# Patient Record
Sex: Male | Born: 1975 | Race: White | Hispanic: No | Marital: Married | Smoking: Never smoker
Health system: Southern US, Community
[De-identification: ages and names within clinical notes are randomized; demographics above are authoritative.]

## PROBLEM LIST (undated history)

## (undated) DIAGNOSIS — C8190 Hodgkin lymphoma, unspecified, unspecified site: Secondary | ICD-10-CM

## (undated) DIAGNOSIS — E079 Disorder of thyroid, unspecified: Secondary | ICD-10-CM

## (undated) DIAGNOSIS — Z8579 Personal history of other malignant neoplasms of lymphoid, hematopoietic and related tissues: Secondary | ICD-10-CM

## (undated) DIAGNOSIS — L709 Acne, unspecified: Secondary | ICD-10-CM

## (undated) HISTORY — DX: Personal history of other malignant neoplasms of lymphoid, hematopoietic and related tissues: Z85.79

## (undated) HISTORY — DX: Disorder of thyroid, unspecified: E07.9

## (undated) HISTORY — DX: Hodgkin lymphoma, unspecified, unspecified site: C81.90

## (undated) HISTORY — DX: Acne, unspecified: L70.9

---

## 2007-05-14 ENCOUNTER — Inpatient Hospital Stay (HOSPITAL_COMMUNITY): Admission: EM | Admit: 2007-05-14 | Discharge: 2007-05-18 | Payer: Self-pay | Admitting: Emergency Medicine

## 2007-05-14 ENCOUNTER — Ambulatory Visit: Payer: Self-pay | Admitting: Cardiology

## 2007-05-14 ENCOUNTER — Ambulatory Visit: Payer: Self-pay | Admitting: Internal Medicine

## 2007-05-14 ENCOUNTER — Ambulatory Visit: Payer: Self-pay | Admitting: Oncology

## 2007-05-15 ENCOUNTER — Encounter (INDEPENDENT_AMBULATORY_CARE_PROVIDER_SITE_OTHER): Payer: Self-pay | Admitting: Interventional Radiology

## 2007-05-16 ENCOUNTER — Encounter (HOSPITAL_COMMUNITY): Payer: Self-pay | Admitting: Oncology

## 2007-05-17 ENCOUNTER — Encounter (HOSPITAL_COMMUNITY): Payer: Self-pay | Admitting: Oncology

## 2007-05-17 ENCOUNTER — Encounter (INDEPENDENT_AMBULATORY_CARE_PROVIDER_SITE_OTHER): Payer: Self-pay | Admitting: Interventional Radiology

## 2007-05-18 ENCOUNTER — Ambulatory Visit: Payer: Self-pay | Admitting: Oncology

## 2007-05-22 ENCOUNTER — Ambulatory Visit (HOSPITAL_COMMUNITY): Admission: RE | Admit: 2007-05-22 | Discharge: 2007-05-22 | Payer: Self-pay | Admitting: Oncology

## 2007-05-23 ENCOUNTER — Encounter (HOSPITAL_COMMUNITY): Payer: Self-pay | Admitting: Oncology

## 2007-05-23 ENCOUNTER — Other Ambulatory Visit: Admission: RE | Admit: 2007-05-23 | Discharge: 2007-05-23 | Payer: Self-pay | Admitting: Oncology

## 2007-05-24 ENCOUNTER — Ambulatory Visit: Admission: RE | Admit: 2007-05-24 | Discharge: 2007-05-24 | Payer: Self-pay | Admitting: Oncology

## 2007-05-25 LAB — CBC WITH DIFFERENTIAL/PLATELET
BASO%: 0.8 % (ref 0.0–2.0)
Basophils Absolute: 0.1 10*3/uL (ref 0.0–0.1)
EOS%: 3 % (ref 0.0–7.0)
HCT: 37.5 % — ABNORMAL LOW (ref 38.7–49.9)
LYMPH%: 5.7 % — ABNORMAL LOW (ref 14.0–48.0)
MCH: 25.3 pg — ABNORMAL LOW (ref 28.0–33.4)
MCHC: 32.8 g/dL (ref 32.0–35.9)
MCV: 77 fL — ABNORMAL LOW (ref 81.6–98.0)
MONO%: 8.8 % (ref 0.0–13.0)
NEUT%: 81.7 % — ABNORMAL HIGH (ref 40.0–75.0)
lymph#: 0.7 10*3/uL — ABNORMAL LOW (ref 0.9–3.3)

## 2007-05-25 LAB — ERYTHROCYTE SEDIMENTATION RATE: Sed Rate: 106 mm/hr — ABNORMAL HIGH (ref 0–20)

## 2007-06-06 ENCOUNTER — Ambulatory Visit (HOSPITAL_COMMUNITY): Admission: RE | Admit: 2007-06-06 | Discharge: 2007-06-06 | Payer: Self-pay | Admitting: Oncology

## 2007-06-06 LAB — COMPREHENSIVE METABOLIC PANEL
Albumin: 4.1 g/dL (ref 3.5–5.2)
Alkaline Phosphatase: 122 U/L — ABNORMAL HIGH (ref 39–117)
CO2: 25 mEq/L (ref 19–32)
Calcium: 9.4 mg/dL (ref 8.4–10.5)
Chloride: 104 mEq/L (ref 96–112)
Glucose, Bld: 100 mg/dL — ABNORMAL HIGH (ref 70–99)
Potassium: 4 mEq/L (ref 3.5–5.3)
Sodium: 141 mEq/L (ref 135–145)
Total Protein: 7.4 g/dL (ref 6.0–8.3)

## 2007-06-06 LAB — CBC WITH DIFFERENTIAL/PLATELET
Basophils Absolute: 0.1 10*3/uL (ref 0.0–0.1)
EOS%: 3.8 % (ref 0.0–7.0)
Eosinophils Absolute: 0.7 10*3/uL — ABNORMAL HIGH (ref 0.0–0.5)
HCT: 39.3 % (ref 38.7–49.9)
HGB: 12.8 g/dL — ABNORMAL LOW (ref 13.0–17.1)
MONO#: 0.9 10*3/uL (ref 0.1–0.9)
NEUT#: 14.5 10*3/uL — ABNORMAL HIGH (ref 1.5–6.5)
NEUT%: 80.5 % — ABNORMAL HIGH (ref 40.0–75.0)
RDW: 16.4 % — ABNORMAL HIGH (ref 11.2–14.6)
WBC: 18.1 10*3/uL — ABNORMAL HIGH (ref 4.0–10.0)
lymph#: 1.8 10*3/uL (ref 0.9–3.3)

## 2007-06-06 LAB — URIC ACID: Uric Acid, Serum: 5.4 mg/dL (ref 4.0–7.8)

## 2007-06-15 LAB — CBC WITH DIFFERENTIAL/PLATELET
BASO%: 0.4 % (ref 0.0–2.0)
EOS%: 2.5 % (ref 0.0–7.0)
HCT: 37.7 % — ABNORMAL LOW (ref 38.7–49.9)
LYMPH%: 12.5 % — ABNORMAL LOW (ref 14.0–48.0)
MCH: 25.6 pg — ABNORMAL LOW (ref 28.0–33.4)
MCHC: 33.1 g/dL (ref 32.0–35.9)
NEUT%: 82.8 % — ABNORMAL HIGH (ref 40.0–75.0)
RBC: 4.88 10*6/uL (ref 4.20–5.71)
lymph#: 1.7 10*3/uL (ref 0.9–3.3)

## 2007-07-04 ENCOUNTER — Ambulatory Visit: Payer: Self-pay | Admitting: Oncology

## 2007-07-06 ENCOUNTER — Ambulatory Visit (HOSPITAL_COMMUNITY): Admission: RE | Admit: 2007-07-06 | Discharge: 2007-07-06 | Payer: Self-pay | Admitting: Oncology

## 2007-07-06 LAB — CBC WITH DIFFERENTIAL/PLATELET
Basophils Absolute: 0.2 10*3/uL — ABNORMAL HIGH (ref 0.0–0.1)
Eosinophils Absolute: 0.5 10*3/uL (ref 0.0–0.5)
HCT: 40.6 % (ref 38.7–49.9)
HGB: 13.5 g/dL (ref 13.0–17.1)
LYMPH%: 13.2 % — ABNORMAL LOW (ref 14.0–48.0)
MCV: 81.2 fL — ABNORMAL LOW (ref 81.6–98.0)
MONO#: 0.9 10*3/uL (ref 0.1–0.9)
MONO%: 6.4 % (ref 0.0–13.0)
NEUT#: 10.4 10*3/uL — ABNORMAL HIGH (ref 1.5–6.5)
NEUT%: 75.4 % — ABNORMAL HIGH (ref 40.0–75.0)
Platelets: 220 10*3/uL (ref 145–400)
RBC: 5 10*6/uL (ref 4.20–5.71)
WBC: 13.8 10*3/uL — ABNORMAL HIGH (ref 4.0–10.0)

## 2007-07-20 LAB — URIC ACID: Uric Acid, Serum: 5.2 mg/dL (ref 4.0–7.8)

## 2007-07-20 LAB — COMPREHENSIVE METABOLIC PANEL
ALT: 32 U/L (ref 0–53)
Alkaline Phosphatase: 87 U/L (ref 39–117)
CO2: 23 mEq/L (ref 19–32)
Creatinine, Ser: 0.71 mg/dL (ref 0.40–1.50)
Sodium: 139 mEq/L (ref 135–145)
Total Bilirubin: 0.6 mg/dL (ref 0.3–1.2)
Total Protein: 6.7 g/dL (ref 6.0–8.3)

## 2007-07-20 LAB — CBC WITH DIFFERENTIAL/PLATELET
BASO%: 1.4 % (ref 0.0–2.0)
HCT: 40.9 % (ref 38.7–49.9)
LYMPH%: 13.3 % — ABNORMAL LOW (ref 14.0–48.0)
MCH: 27.6 pg — ABNORMAL LOW (ref 28.0–33.4)
MCHC: 33.8 g/dL (ref 32.0–35.9)
MCV: 81.5 fL — ABNORMAL LOW (ref 81.6–98.0)
MONO%: 7.7 % (ref 0.0–13.0)
NEUT%: 73.7 % (ref 40.0–75.0)
Platelets: 252 10*3/uL (ref 145–400)
RBC: 5.02 10*6/uL (ref 4.20–5.71)

## 2007-07-31 LAB — COMPREHENSIVE METABOLIC PANEL
ALT: 39 U/L (ref 0–53)
AST: 26 U/L (ref 0–37)
Alkaline Phosphatase: 103 U/L (ref 39–117)
Creatinine, Ser: 0.82 mg/dL (ref 0.40–1.50)
Sodium: 142 mEq/L (ref 135–145)
Total Bilirubin: 0.7 mg/dL (ref 0.3–1.2)
Total Protein: 7 g/dL (ref 6.0–8.3)

## 2007-07-31 LAB — CBC WITH DIFFERENTIAL/PLATELET
BASO%: 0.1 % (ref 0.0–2.0)
EOS%: 1.2 % (ref 0.0–7.0)
HCT: 38.2 % — ABNORMAL LOW (ref 38.7–49.9)
LYMPH%: 8.8 % — ABNORMAL LOW (ref 14.0–48.0)
MCH: 27.8 pg — ABNORMAL LOW (ref 28.0–33.4)
MCHC: 34 g/dL (ref 32.0–35.9)
MONO#: 1 10*3/uL — ABNORMAL HIGH (ref 0.1–0.9)
NEUT%: 84.4 % — ABNORMAL HIGH (ref 40.0–75.0)
Platelets: 301 10*3/uL (ref 145–400)
RBC: 4.68 10*6/uL (ref 4.20–5.71)
WBC: 18.2 10*3/uL — ABNORMAL HIGH (ref 4.0–10.0)

## 2007-08-09 LAB — CBC WITH DIFFERENTIAL/PLATELET
BASO%: 0.3 % (ref 0.0–2.0)
EOS%: 2.3 % (ref 0.0–7.0)
MCH: 28.1 pg (ref 28.0–33.4)
MCHC: 34.1 g/dL (ref 32.0–35.9)
MONO#: 0.6 10*3/uL (ref 0.1–0.9)
NEUT%: 83.2 % — ABNORMAL HIGH (ref 40.0–75.0)
RBC: 4.84 10*6/uL (ref 4.20–5.71)
RDW: 23.7 % — ABNORMAL HIGH (ref 11.2–14.6)
WBC: 13.9 10*3/uL — ABNORMAL HIGH (ref 4.0–10.0)
lymph#: 1.3 10*3/uL (ref 0.9–3.3)

## 2007-08-13 ENCOUNTER — Ambulatory Visit: Payer: Self-pay | Admitting: Oncology

## 2007-08-16 LAB — CBC WITH DIFFERENTIAL/PLATELET
Basophils Absolute: 0.2 10*3/uL — ABNORMAL HIGH (ref 0.0–0.1)
EOS%: 2.9 % (ref 0.0–7.0)
HGB: 13.8 g/dL (ref 13.0–17.1)
MCH: 28.1 pg (ref 28.0–33.4)
MONO#: 0.9 10*3/uL (ref 0.1–0.9)
NEUT#: 10.3 10*3/uL — ABNORMAL HIGH (ref 1.5–6.5)
RDW: 20.1 % — ABNORMAL HIGH (ref 11.2–14.6)
WBC: 13.4 10*3/uL — ABNORMAL HIGH (ref 4.0–10.0)
lymph#: 1.6 10*3/uL (ref 0.9–3.3)

## 2007-08-16 LAB — COMPREHENSIVE METABOLIC PANEL
AST: 27 U/L (ref 0–37)
Alkaline Phosphatase: 77 U/L (ref 39–117)
BUN: 13 mg/dL (ref 6–23)
Calcium: 9.3 mg/dL (ref 8.4–10.5)
Creatinine, Ser: 0.78 mg/dL (ref 0.40–1.50)
Glucose, Bld: 84 mg/dL (ref 70–99)

## 2007-08-28 LAB — CBC WITH DIFFERENTIAL/PLATELET
Basophils Absolute: 0.1 10*3/uL (ref 0.0–0.1)
Eosinophils Absolute: 0.2 10*3/uL (ref 0.0–0.5)
HGB: 12.9 g/dL — ABNORMAL LOW (ref 13.0–17.1)
MCV: 86.6 fL (ref 81.6–98.0)
MONO#: 0.8 10*3/uL (ref 0.1–0.9)
MONO%: 5.6 % (ref 0.0–13.0)
NEUT#: 11.3 10*3/uL — ABNORMAL HIGH (ref 1.5–6.5)
RDW: 22.2 % — ABNORMAL HIGH (ref 11.2–14.6)
WBC: 13.8 10*3/uL — ABNORMAL HIGH (ref 4.0–10.0)

## 2007-09-10 ENCOUNTER — Ambulatory Visit (HOSPITAL_COMMUNITY): Admission: RE | Admit: 2007-09-10 | Discharge: 2007-09-10 | Payer: Self-pay | Admitting: Oncology

## 2007-09-28 ENCOUNTER — Ambulatory Visit: Payer: Self-pay | Admitting: Oncology

## 2007-09-28 LAB — CBC WITH DIFFERENTIAL/PLATELET
BASO%: 1.3 % (ref 0.0–2.0)
EOS%: 2.6 % (ref 0.0–7.0)
HCT: 40.4 % (ref 38.7–49.9)
LYMPH%: 10.2 % — ABNORMAL LOW (ref 14.0–48.0)
MCH: 30.4 pg (ref 28.0–33.4)
MCHC: 34.1 g/dL (ref 32.0–35.9)
NEUT%: 79.3 % — ABNORMAL HIGH (ref 40.0–75.0)
Platelets: 256 10*3/uL (ref 145–400)
RBC: 4.53 10*6/uL (ref 4.20–5.71)
WBC: 11.3 10*3/uL — ABNORMAL HIGH (ref 4.0–10.0)

## 2007-10-12 LAB — LACTATE DEHYDROGENASE: LDH: 217 U/L (ref 94–250)

## 2007-10-12 LAB — COMPREHENSIVE METABOLIC PANEL
AST: 30 U/L (ref 0–37)
Albumin: 4.3 g/dL (ref 3.5–5.2)
BUN: 13 mg/dL (ref 6–23)
CO2: 24 mEq/L (ref 19–32)
Chloride: 107 mEq/L (ref 96–112)
Glucose, Bld: 122 mg/dL — ABNORMAL HIGH (ref 70–99)
Potassium: 4.2 mEq/L (ref 3.5–5.3)

## 2007-10-12 LAB — CBC WITH DIFFERENTIAL/PLATELET
Eosinophils Absolute: 0.5 10*3/uL (ref 0.0–0.5)
LYMPH%: 10.2 % — ABNORMAL LOW (ref 14.0–48.0)
MONO#: 0.9 10*3/uL (ref 0.1–0.9)
NEUT#: 9.6 10*3/uL — ABNORMAL HIGH (ref 1.5–6.5)
Platelets: 274 10*3/uL (ref 145–400)
RBC: 4.37 10*6/uL (ref 4.20–5.71)
WBC: 12.5 10*3/uL — ABNORMAL HIGH (ref 4.0–10.0)

## 2007-10-26 LAB — CBC WITH DIFFERENTIAL/PLATELET
BASO%: 1.8 % (ref 0.0–2.0)
Eosinophils Absolute: 0.3 10*3/uL (ref 0.0–0.5)
HCT: 39.1 % (ref 38.7–49.9)
LYMPH%: 11.1 % — ABNORMAL LOW (ref 14.0–48.0)
MCHC: 33.9 g/dL (ref 32.0–35.9)
MCV: 89.5 fL (ref 81.6–98.0)
MONO#: 0.8 10*3/uL (ref 0.1–0.9)
MONO%: 8.2 % (ref 0.0–13.0)
NEUT%: 75.5 % — ABNORMAL HIGH (ref 40.0–75.0)
Platelets: 276 10*3/uL (ref 145–400)
RBC: 4.37 10*6/uL (ref 4.20–5.71)
WBC: 9.5 10*3/uL (ref 4.0–10.0)

## 2007-11-06 ENCOUNTER — Ambulatory Visit (HOSPITAL_COMMUNITY): Admission: RE | Admit: 2007-11-06 | Discharge: 2007-11-06 | Payer: Self-pay | Admitting: Oncology

## 2007-11-09 ENCOUNTER — Ambulatory Visit: Admission: RE | Admit: 2007-11-09 | Discharge: 2008-01-07 | Payer: Self-pay | Admitting: Radiation Oncology

## 2007-11-09 LAB — COMPREHENSIVE METABOLIC PANEL
BUN: 17 mg/dL (ref 6–23)
CO2: 22 mEq/L (ref 19–32)
Calcium: 9.2 mg/dL (ref 8.4–10.5)
Chloride: 103 mEq/L (ref 96–112)
Creatinine, Ser: 0.77 mg/dL (ref 0.40–1.50)
Glucose, Bld: 76 mg/dL (ref 70–99)

## 2007-11-09 LAB — CBC WITH DIFFERENTIAL/PLATELET
Eosinophils Absolute: 0.4 10*3/uL (ref 0.0–0.5)
HCT: 40 % (ref 38.7–49.9)
LYMPH%: 7.9 % — ABNORMAL LOW (ref 14.0–48.0)
MCHC: 33.5 g/dL (ref 32.0–35.9)
MCV: 89.7 fL (ref 81.6–98.0)
MONO#: 1.2 10*3/uL — ABNORMAL HIGH (ref 0.1–0.9)
MONO%: 8.3 % (ref 0.0–13.0)
NEUT%: 79.7 % — ABNORMAL HIGH (ref 40.0–75.0)
Platelets: 243 10*3/uL (ref 145–400)
RBC: 4.46 10*6/uL (ref 4.20–5.71)
WBC: 13.9 10*3/uL — ABNORMAL HIGH (ref 4.0–10.0)

## 2007-11-09 LAB — LACTATE DEHYDROGENASE: LDH: 231 U/L (ref 94–250)

## 2007-11-20 ENCOUNTER — Ambulatory Visit: Payer: Self-pay | Admitting: Oncology

## 2007-11-22 LAB — CBC WITH DIFFERENTIAL/PLATELET
Basophils Absolute: 0.1 10*3/uL (ref 0.0–0.1)
EOS%: 2.3 % (ref 0.0–7.0)
Eosinophils Absolute: 0.3 10*3/uL (ref 0.0–0.5)
HGB: 13.7 g/dL (ref 13.0–17.1)
MCH: 30.8 pg (ref 28.0–33.4)
MONO#: 1.1 10*3/uL — ABNORMAL HIGH (ref 0.1–0.9)
NEUT#: 10.7 10*3/uL — ABNORMAL HIGH (ref 1.5–6.5)
RDW: 15.1 % — ABNORMAL HIGH (ref 11.2–14.6)
WBC: 13.1 10*3/uL — ABNORMAL HIGH (ref 4.0–10.0)
lymph#: 0.9 10*3/uL (ref 0.9–3.3)

## 2007-12-04 LAB — CBC WITH DIFFERENTIAL/PLATELET
BASO%: 0.1 % (ref 0.0–2.0)
Basophils Absolute: 0 10*3/uL (ref 0.0–0.1)
EOS%: 4.3 % (ref 0.0–7.0)
HCT: 40 % (ref 38.7–49.9)
HGB: 13.7 g/dL (ref 13.0–17.1)
MCH: 30.6 pg (ref 28.0–33.4)
MCHC: 34.2 g/dL (ref 32.0–35.9)
MCV: 89.5 fL (ref 81.6–98.0)
MONO%: 8.4 % (ref 0.0–13.0)
NEUT%: 80.6 % — ABNORMAL HIGH (ref 40.0–75.0)
RDW: 14.2 % (ref 11.2–14.6)
lymph#: 0.5 10*3/uL — ABNORMAL LOW (ref 0.9–3.3)

## 2007-12-21 LAB — COMPREHENSIVE METABOLIC PANEL
ALT: 29 U/L (ref 0–53)
BUN: 15 mg/dL (ref 6–23)
CO2: 23 mEq/L (ref 19–32)
Calcium: 9.1 mg/dL (ref 8.4–10.5)
Chloride: 107 mEq/L (ref 96–112)
Creatinine, Ser: 0.79 mg/dL (ref 0.40–1.50)

## 2007-12-21 LAB — CBC WITH DIFFERENTIAL/PLATELET
Basophils Absolute: 0 10*3/uL (ref 0.0–0.1)
Eosinophils Absolute: 0.5 10*3/uL (ref 0.0–0.5)
HCT: 40.6 % (ref 38.7–49.9)
LYMPH%: 5.4 % — ABNORMAL LOW (ref 14.0–48.0)
MONO#: 0.5 10*3/uL (ref 0.1–0.9)
NEUT#: 5.8 10*3/uL (ref 1.5–6.5)
NEUT%: 80.3 % — ABNORMAL HIGH (ref 40.0–75.0)
Platelets: 169 10*3/uL (ref 145–400)
WBC: 7.3 10*3/uL (ref 4.0–10.0)

## 2008-01-03 LAB — COMPREHENSIVE METABOLIC PANEL
ALT: 33 U/L (ref 0–53)
AST: 23 U/L (ref 0–37)
CO2: 22 mEq/L (ref 19–32)
Calcium: 9.1 mg/dL (ref 8.4–10.5)
Chloride: 109 mEq/L (ref 96–112)
Sodium: 141 mEq/L (ref 135–145)
Total Protein: 6.5 g/dL (ref 6.0–8.3)

## 2008-01-03 LAB — CBC WITH DIFFERENTIAL/PLATELET
BASO%: 0.3 % (ref 0.0–2.0)
Eosinophils Absolute: 0.7 10*3/uL — ABNORMAL HIGH (ref 0.0–0.5)
MCHC: 34.4 g/dL (ref 32.0–35.9)
MONO#: 0.5 10*3/uL (ref 0.1–0.9)
NEUT#: 3.7 10*3/uL (ref 1.5–6.5)
RBC: 4.58 10*6/uL (ref 4.20–5.71)
RDW: 12.9 % (ref 11.2–14.6)
WBC: 5.3 10*3/uL (ref 4.0–10.0)
lymph#: 0.4 10*3/uL — ABNORMAL LOW (ref 0.9–3.3)

## 2008-01-03 LAB — LACTATE DEHYDROGENASE: LDH: 137 U/L (ref 94–250)

## 2008-01-03 LAB — ERYTHROCYTE SEDIMENTATION RATE: Sed Rate: 12 mm/hr (ref 0–20)

## 2008-02-19 ENCOUNTER — Ambulatory Visit: Payer: Self-pay | Admitting: Oncology

## 2008-04-04 LAB — LACTATE DEHYDROGENASE: LDH: 161 U/L (ref 94–250)

## 2008-04-04 LAB — CBC WITH DIFFERENTIAL/PLATELET
Eosinophils Absolute: 0.2 10*3/uL (ref 0.0–0.5)
HCT: 42.7 % (ref 38.4–49.9)
LYMPH%: 14.4 % (ref 14.0–49.0)
MONO#: 0.3 10*3/uL (ref 0.1–0.9)
NEUT#: 4.3 10*3/uL (ref 1.5–6.5)
NEUT%: 76.3 % — ABNORMAL HIGH (ref 39.0–75.0)
Platelets: 176 10*3/uL (ref 140–400)
WBC: 5.6 10*3/uL (ref 4.0–10.3)

## 2008-04-04 LAB — COMPREHENSIVE METABOLIC PANEL
BUN: 15 mg/dL (ref 6–23)
CO2: 21 mEq/L (ref 19–32)
Calcium: 9.5 mg/dL (ref 8.4–10.5)
Chloride: 106 mEq/L (ref 96–112)
Creatinine, Ser: 0.78 mg/dL (ref 0.40–1.50)

## 2008-04-04 LAB — TSH: TSH: 3.616 u[IU]/mL (ref 0.350–4.500)

## 2008-04-07 ENCOUNTER — Ambulatory Visit (HOSPITAL_COMMUNITY): Admission: RE | Admit: 2008-04-07 | Discharge: 2008-04-07 | Payer: Self-pay | Admitting: Oncology

## 2008-06-03 ENCOUNTER — Ambulatory Visit: Payer: Self-pay | Admitting: Oncology

## 2008-06-05 LAB — CBC WITH DIFFERENTIAL/PLATELET
BASO%: 0.4 % (ref 0.0–2.0)
EOS%: 2.3 % (ref 0.0–7.0)
HCT: 42.3 % (ref 38.4–49.9)
LYMPH%: 14.9 % (ref 14.0–49.0)
MCH: 29.5 pg (ref 27.2–33.4)
MCHC: 34.3 g/dL (ref 32.0–36.0)
MCV: 86.1 fL (ref 79.3–98.0)
MONO#: 0.4 10*3/uL (ref 0.1–0.9)
MONO%: 6.9 % (ref 0.0–14.0)
NEUT%: 75.5 % — ABNORMAL HIGH (ref 39.0–75.0)
Platelets: 149 10*3/uL (ref 140–400)

## 2008-06-05 LAB — COMPREHENSIVE METABOLIC PANEL
AST: 22 U/L (ref 0–37)
BUN: 20 mg/dL (ref 6–23)
Calcium: 9.4 mg/dL (ref 8.4–10.5)
Chloride: 106 mEq/L (ref 96–112)
Creatinine, Ser: 0.93 mg/dL (ref 0.40–1.50)

## 2008-09-11 ENCOUNTER — Ambulatory Visit: Payer: Self-pay | Admitting: Oncology

## 2008-09-12 ENCOUNTER — Ambulatory Visit (HOSPITAL_COMMUNITY): Admission: RE | Admit: 2008-09-12 | Discharge: 2008-09-12 | Payer: Self-pay | Admitting: Oncology

## 2008-09-15 LAB — CBC WITH DIFFERENTIAL/PLATELET
Basophils Absolute: 0 10*3/uL (ref 0.0–0.1)
Eosinophils Absolute: 0.1 10*3/uL (ref 0.0–0.5)
HCT: 44 % (ref 38.4–49.9)
HGB: 15.1 g/dL (ref 13.0–17.1)
MONO#: 0.4 10*3/uL (ref 0.1–0.9)
NEUT#: 3.7 10*3/uL (ref 1.5–6.5)
NEUT%: 73.4 % (ref 39.0–75.0)
RDW: 13.4 % (ref 11.0–14.6)
lymph#: 0.8 10*3/uL — ABNORMAL LOW (ref 0.9–3.3)

## 2008-09-15 LAB — COMPREHENSIVE METABOLIC PANEL
ALT: 32 U/L (ref 0–53)
AST: 20 U/L (ref 0–37)
Alkaline Phosphatase: 48 U/L (ref 39–117)
Potassium: 4.3 mEq/L (ref 3.5–5.3)
Sodium: 140 mEq/L (ref 135–145)
Total Bilirubin: 1.2 mg/dL (ref 0.3–1.2)
Total Protein: 7.1 g/dL (ref 6.0–8.3)

## 2008-09-15 LAB — ERYTHROCYTE SEDIMENTATION RATE: Sed Rate: 3 mm/hr (ref 0–20)

## 2009-01-13 ENCOUNTER — Ambulatory Visit: Payer: Self-pay | Admitting: Oncology

## 2009-01-15 LAB — CBC WITH DIFFERENTIAL/PLATELET
BASO%: 0.2 % (ref 0.0–2.0)
EOS%: 2.3 % (ref 0.0–7.0)
MCH: 30.3 pg (ref 27.2–33.4)
MCHC: 33.9 g/dL (ref 32.0–36.0)
MCV: 89.3 fL (ref 79.3–98.0)
MONO%: 9.1 % (ref 0.0–14.0)
NEUT%: 70.5 % (ref 39.0–75.0)
RDW: 12.8 % (ref 11.0–14.6)
lymph#: 1.1 10*3/uL (ref 0.9–3.3)

## 2009-01-15 LAB — COMPREHENSIVE METABOLIC PANEL
AST: 22 U/L (ref 0–37)
Alkaline Phosphatase: 50 U/L (ref 39–117)
BUN: 20 mg/dL (ref 6–23)
Glucose, Bld: 89 mg/dL (ref 70–99)
Potassium: 4 mEq/L (ref 3.5–5.3)
Sodium: 141 mEq/L (ref 135–145)
Total Bilirubin: 0.9 mg/dL (ref 0.3–1.2)
Total Protein: 7.2 g/dL (ref 6.0–8.3)

## 2009-01-15 LAB — SEDIMENTATION RATE: Sed Rate: 8 mm/hr (ref 0–16)

## 2009-01-15 LAB — TSH: TSH: 3.874 u[IU]/mL (ref 0.350–4.500)

## 2009-05-13 ENCOUNTER — Ambulatory Visit: Payer: Self-pay | Admitting: Oncology

## 2009-05-13 ENCOUNTER — Ambulatory Visit (HOSPITAL_COMMUNITY): Admission: RE | Admit: 2009-05-13 | Discharge: 2009-05-13 | Payer: Self-pay | Admitting: Oncology

## 2009-05-15 LAB — CBC WITH DIFFERENTIAL/PLATELET
Basophils Absolute: 0 10*3/uL (ref 0.0–0.1)
EOS%: 1 % (ref 0.0–7.0)
HGB: 14.6 g/dL (ref 13.0–17.1)
MCHC: 34.3 g/dL (ref 32.0–36.0)
MCV: 88.9 fL (ref 79.3–98.0)
MONO#: 0.6 10*3/uL (ref 0.1–0.9)
NEUT#: 5.1 10*3/uL (ref 1.5–6.5)
NEUT%: 76.2 % — ABNORMAL HIGH (ref 39.0–75.0)
Platelets: 179 10*3/uL (ref 140–400)
RBC: 4.78 10*6/uL (ref 4.20–5.82)
RDW: 13.1 % (ref 11.0–14.6)

## 2009-05-15 LAB — COMPREHENSIVE METABOLIC PANEL
ALT: 43 U/L (ref 0–53)
AST: 33 U/L (ref 0–37)
Albumin: 4.2 g/dL (ref 3.5–5.2)
Alkaline Phosphatase: 47 U/L (ref 39–117)
BUN: 17 mg/dL (ref 6–23)
CO2: 27 mEq/L (ref 19–32)
Creatinine, Ser: 0.93 mg/dL (ref 0.40–1.50)
Potassium: 3.8 mEq/L (ref 3.5–5.3)
Total Bilirubin: 1.3 mg/dL — ABNORMAL HIGH (ref 0.3–1.2)
Total Protein: 7.6 g/dL (ref 6.0–8.3)

## 2009-05-15 LAB — LACTATE DEHYDROGENASE: LDH: 147 U/L (ref 94–250)

## 2009-07-15 ENCOUNTER — Ambulatory Visit: Payer: Self-pay | Admitting: Oncology

## 2009-07-17 LAB — T4, FREE: Free T4: 0.99 ng/dL (ref 0.80–1.80)

## 2009-09-24 ENCOUNTER — Ambulatory Visit: Payer: Self-pay | Admitting: Oncology

## 2009-09-28 LAB — CBC WITH DIFFERENTIAL/PLATELET
BASO%: 0.4 % (ref 0.0–2.0)
Basophils Absolute: 0 10*3/uL (ref 0.0–0.1)
HCT: 45 % (ref 38.4–49.9)
HGB: 15.2 g/dL (ref 13.0–17.1)
MCHC: 33.8 g/dL (ref 32.0–36.0)
MCV: 87.7 fL (ref 79.3–98.0)
NEUT#: 4.5 10*3/uL (ref 1.5–6.5)
Platelets: 166 10*3/uL (ref 140–400)
RDW: 12.9 % (ref 11.0–14.6)
lymph#: 1.1 10*3/uL (ref 0.9–3.3)

## 2009-09-29 LAB — COMPREHENSIVE METABOLIC PANEL
ALT: 39 U/L (ref 0–53)
AST: 24 U/L (ref 0–37)
Albumin: 4.5 g/dL (ref 3.5–5.2)
Alkaline Phosphatase: 43 U/L (ref 39–117)
CO2: 24 mEq/L (ref 19–32)
Chloride: 106 mEq/L (ref 96–112)
Creatinine, Ser: 0.93 mg/dL (ref 0.40–1.50)
Potassium: 3.8 mEq/L (ref 3.5–5.3)
Sodium: 141 mEq/L (ref 135–145)
Total Bilirubin: 1.2 mg/dL (ref 0.3–1.2)

## 2009-09-29 LAB — TSH: TSH: 4.719 u[IU]/mL — ABNORMAL HIGH (ref 0.350–4.500)

## 2010-01-21 ENCOUNTER — Ambulatory Visit: Payer: Self-pay | Admitting: Oncology

## 2010-01-22 LAB — CBC WITH DIFFERENTIAL/PLATELET
BASO%: 0.4 % (ref 0.0–2.0)
Basophils Absolute: 0 10*3/uL (ref 0.0–0.1)
Eosinophils Absolute: 0.2 10*3/uL (ref 0.0–0.5)
HCT: 43.8 % (ref 38.4–49.9)
LYMPH%: 17.2 % (ref 14.0–49.0)
MCH: 30.2 pg (ref 27.2–33.4)
MCHC: 34.6 g/dL (ref 32.0–36.0)
MONO#: 0.4 10*3/uL (ref 0.1–0.9)
MONO%: 7.8 % (ref 0.0–14.0)
NEUT%: 71.8 % (ref 39.0–75.0)
RDW: 13.3 % (ref 11.0–14.6)
lymph#: 1 10*3/uL (ref 0.9–3.3)

## 2010-01-22 LAB — COMPREHENSIVE METABOLIC PANEL
ALT: 35 U/L (ref 0–53)
Albumin: 4.4 g/dL (ref 3.5–5.2)
CO2: 24 mEq/L (ref 19–32)
Calcium: 9.4 mg/dL (ref 8.4–10.5)
Creatinine, Ser: 0.95 mg/dL (ref 0.40–1.50)
Sodium: 140 mEq/L (ref 135–145)
Total Protein: 6.8 g/dL (ref 6.0–8.3)

## 2010-01-22 LAB — SEDIMENTATION RATE: Sed Rate: 5 mm/hr (ref 0–16)

## 2010-01-22 LAB — LACTATE DEHYDROGENASE: LDH: 153 U/L (ref 94–250)

## 2010-01-22 LAB — TSH: TSH: 3.349 u[IU]/mL (ref 0.350–4.500)

## 2010-01-27 ENCOUNTER — Ambulatory Visit (HOSPITAL_COMMUNITY)
Admission: RE | Admit: 2010-01-27 | Discharge: 2010-01-27 | Payer: Self-pay | Source: Home / Self Care | Attending: Oncology | Admitting: Oncology

## 2010-02-21 ENCOUNTER — Encounter (HOSPITAL_COMMUNITY): Payer: Self-pay | Admitting: Oncology

## 2010-03-01 IMAGING — CR DG CHEST 2V
2 series · 2 of 2 positions shown · non-contrast
Comparison: One-view chest x-ray 05/15/2007.  CT chest 05/14/2007.

CLINICAL DATA: Non-Hodgkins lymphoma.

CHEST - 2 VIEW 06/06/2007:

[w chest pa]
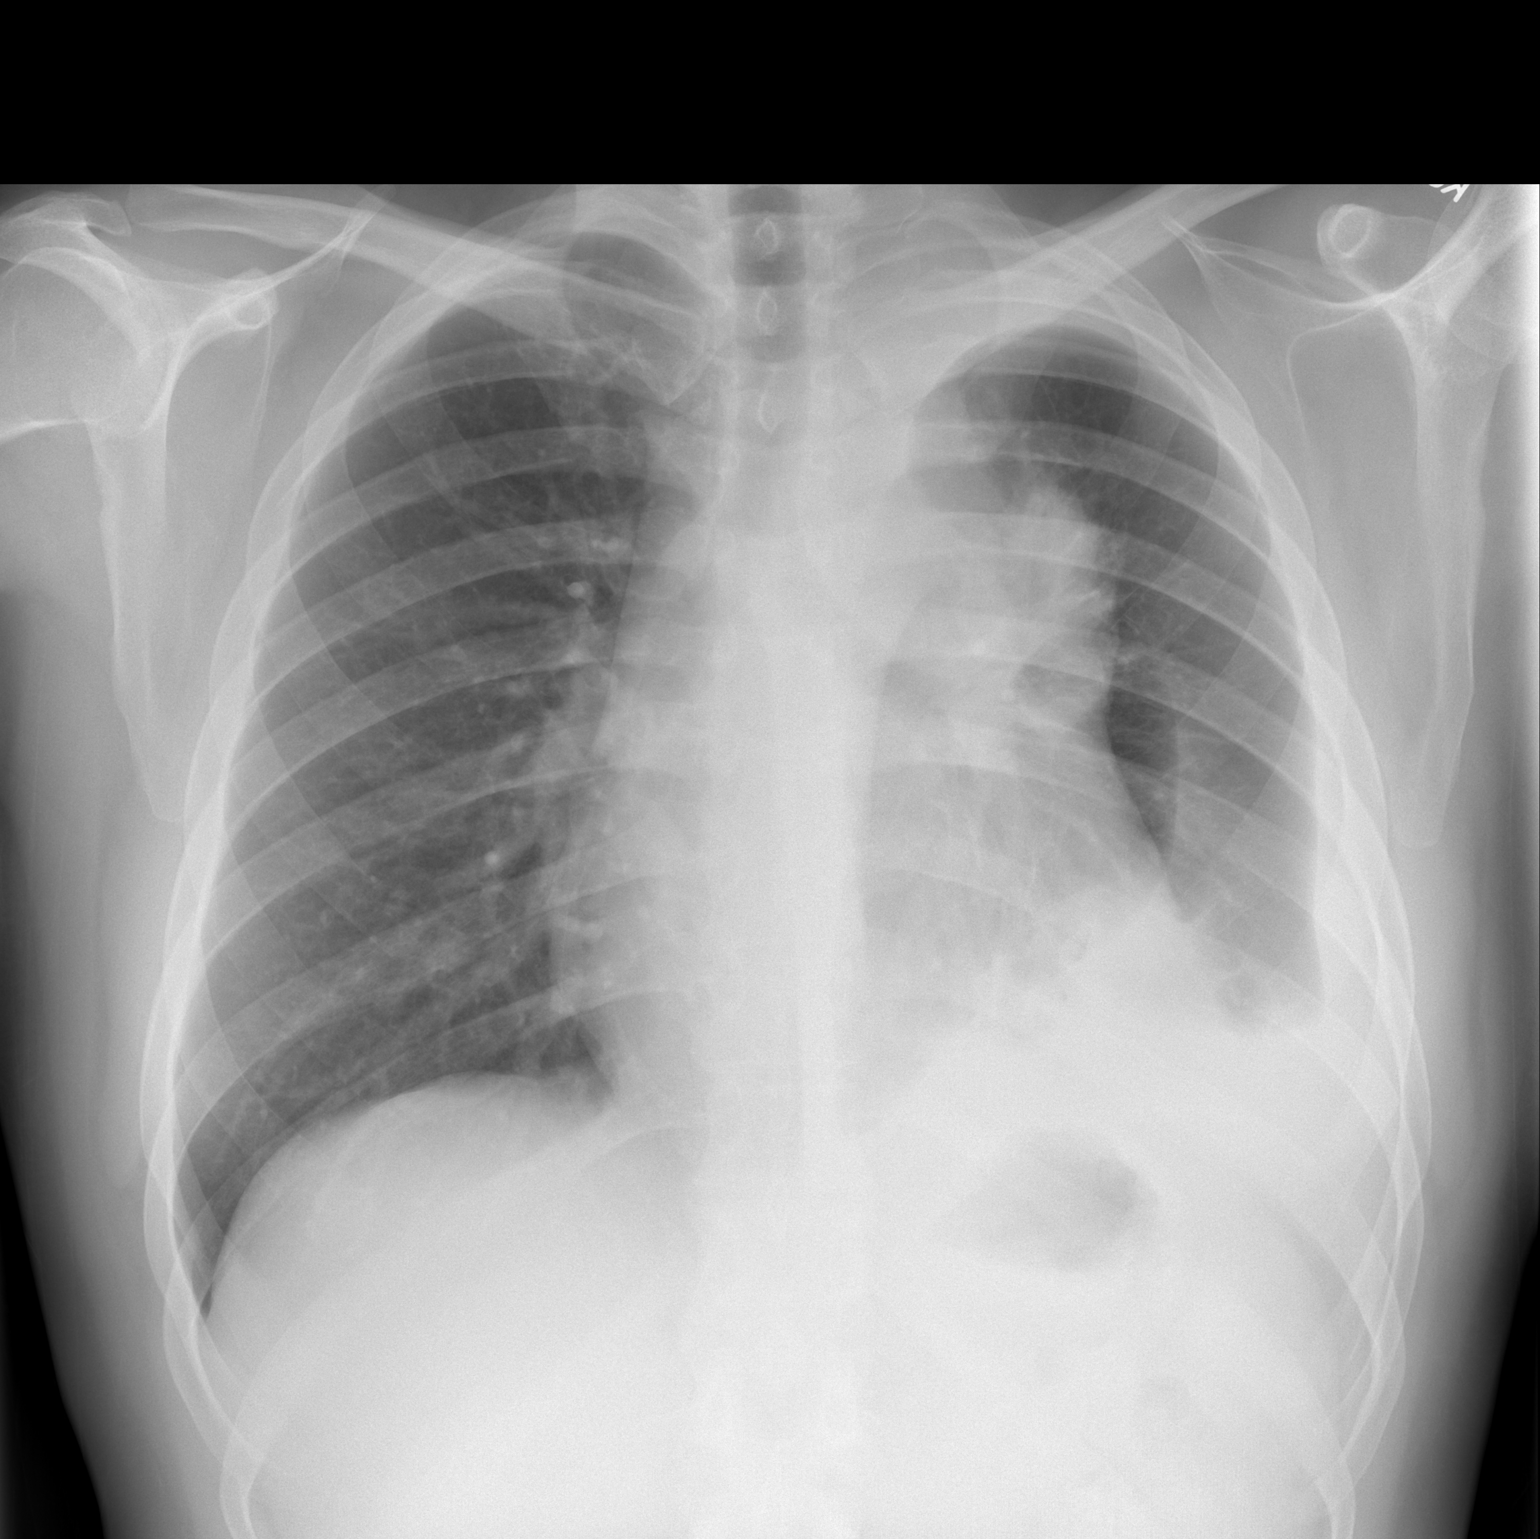

[w chest lat *]
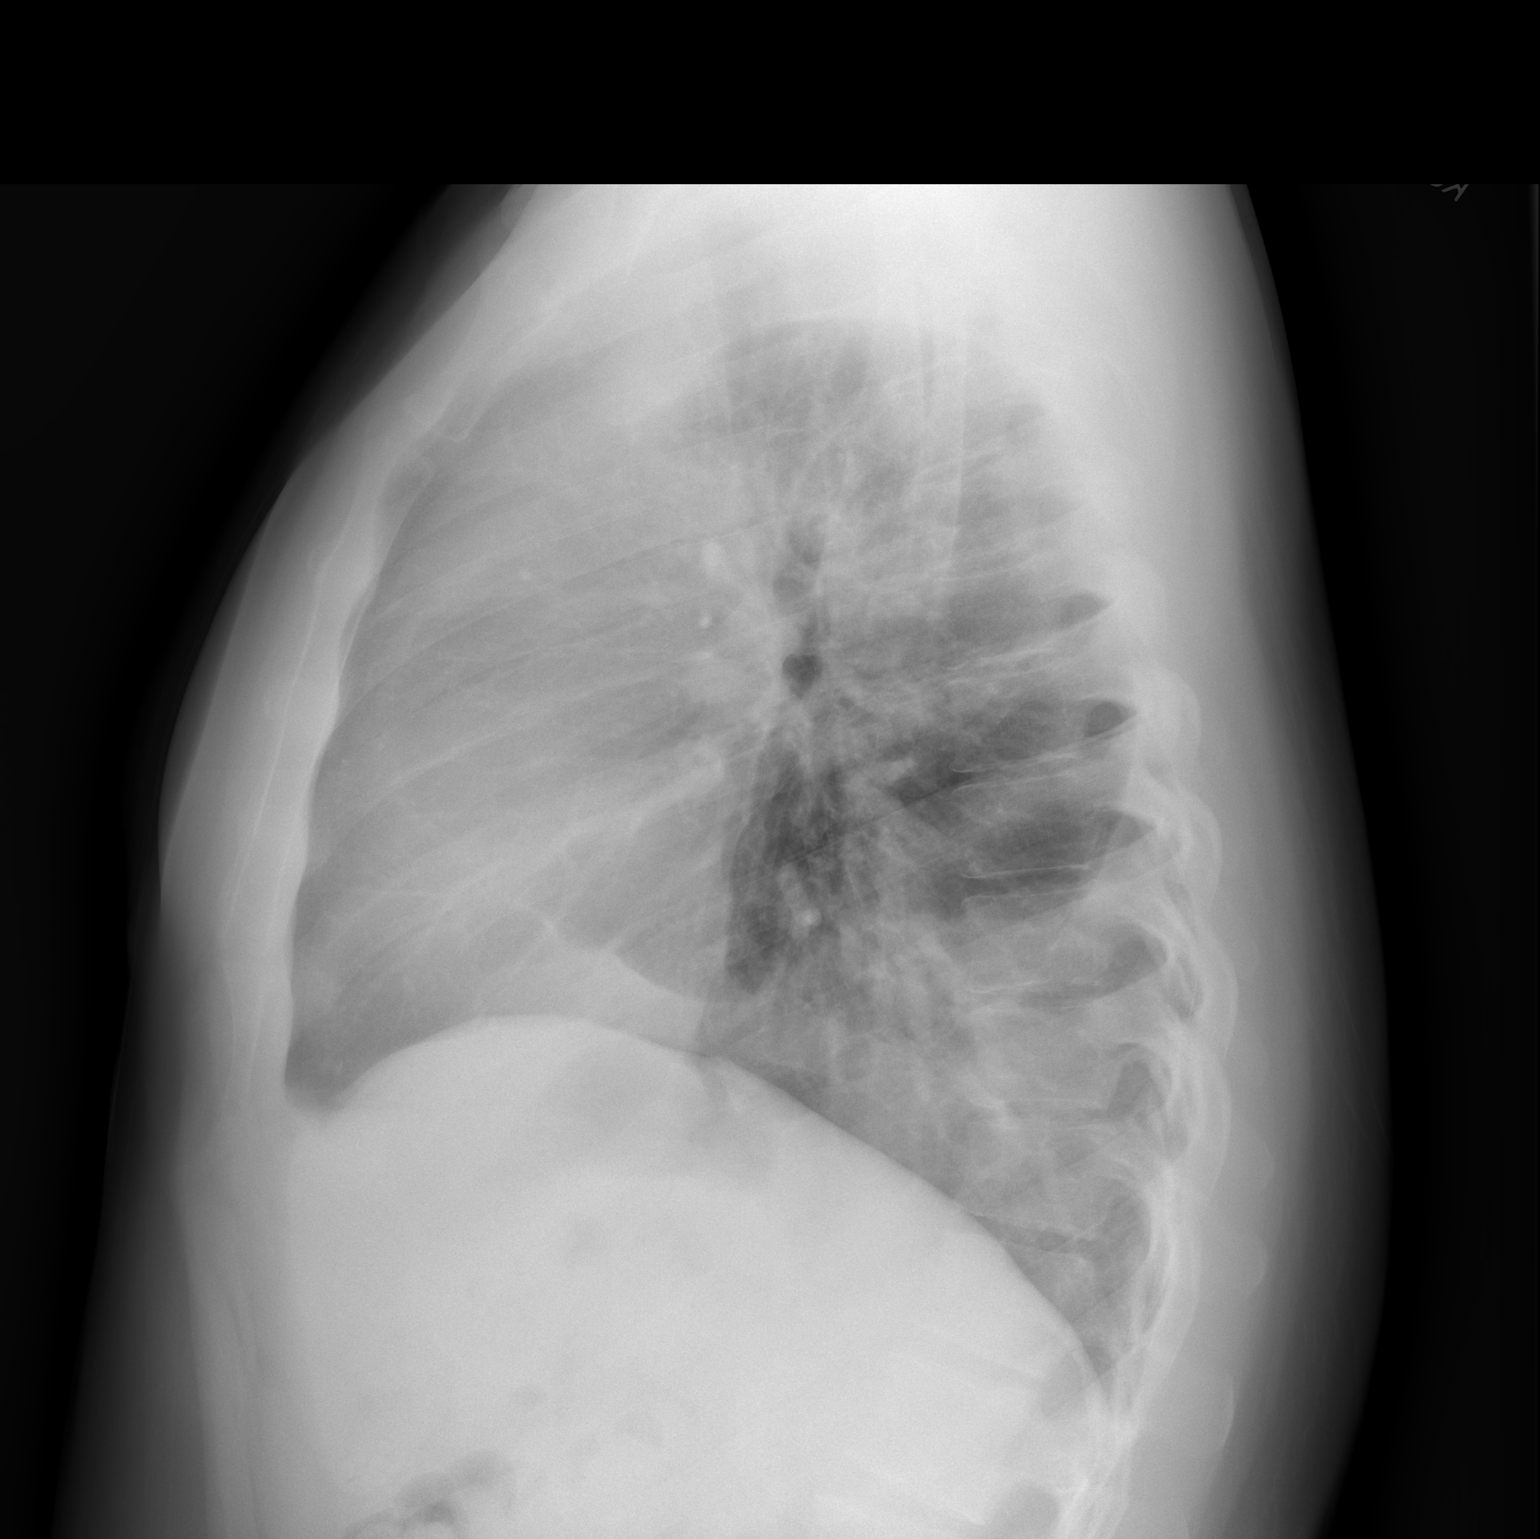

[2 of 2 positions shown; findings below may reference images not displayed]

FINDINGS: Heart size upper normal and stable.  Mediastinal mass
extending into the left suprahilar region, decreased in size in the
interval.  Interval decrease in size of the left pleural effusion,
still moderate in size.  Consolidation in the left lower lobe
improved.  Right lung clear.  No right pleural effusion.
Visualized bony thorax intact.
IMPRESSION: Decreasing and mediastinal mass extending into the left hilum.
Decreasing left pleural effusion, with moderate effusion
persisting.  Improved aeration of the left lower lobe, with mild
residual passive atelectasis.  No new abnormalities.

## 2010-04-12 LAB — GLUCOSE, CAPILLARY: Glucose-Capillary: 109 mg/dL — ABNORMAL HIGH (ref 70–99)

## 2010-05-08 LAB — GLUCOSE, CAPILLARY: Glucose-Capillary: 98 mg/dL (ref 70–99)

## 2010-06-15 NOTE — Discharge Summary (Signed)
NAME:  Garrett Bender, Garrett Bender              ACCOUNT NO.:  [id number]   MEDICAL RECORD NO.:  [id number]          PATIENT TYPE:  INP   LOCATION:  1305                         FACILITY:  [facility]   PHYSICIAN:  Lonia Blood, M.D.DATE OF BIRTH:  [birth year]   DATE OF ADMISSION:  05/14/2007  DATE OF DISCHARGE:  05/18/2007                               DISCHARGE SUMMARY   PRIMARY CARE PHYSICIAN:  Unassigned.   ONCOLOGIST:  Samul Dada, M.D.   DISCHARGE DIAGNOSES:  1. A 15.3 x 15.7 cm anterior mediastinal mass.      a.     Associated large left pleural effusion.      b.     Multiple anterior neck and supraclavicular region pathologic       lymph nodes.      c.     Status post supraclavicular node biopsy with path pending.      d.     CT scan abdomen and pelvis unrevealing/without evidence of       metastatic disease.      e.     Ultrasound of scrotum revealing normal-appearing testicles       and no evidence of testicular tumor.      f.     Presumptive initial diagnosis of lymphoma.      g.     Empiric initiation of allopurinol.      h.     PET scan scheduled for May 21, 2007, with subsequent       followup with Dr. Arline Asp.  2. Possible community acquired MRSA (methicillin resistant      Staphylococcus aureus) - cellulitis about the left knee - resolved      - treatment course completed.   DISCHARGE MEDICATIONS:  Allopurinol 300 mg p.o. daily.   FOLLOWUP:  The patient will be contacted by Dr. Arline Asp for followup.  He is scheduled for an outpatient PET scan to be accomplished on May 21, 2007.  When the results of path are finalized and PET scan results  are available the patient will be contacted by Dr. Arline Asp for ongoing  coordination of care.  The patient is considering sperm banking.  This  will be directed through Dr. Mamie Levers office.   CONSULTATIONS:  1. [facility].  2. Samul Dada, M.D.  3. Interventional radiology.   PROCEDURES:  1. Ultrasound guided left-sided thoracentesis productive of 1.7 liters      of fluid with unrevealing cytology results.  2. CT scan of the chest May 14, 2007, large anterior mediastinal      soft tissue mass with associated left to right mediastinal shift.      Large left pleural effusion compatible with malignant effusion.  3. CT scan abdomen and pelvis May 15, 2007, no evidence of abdominal      metastatic disease or other acute findings.  Large left pleural      effusion.  No evidence of pelvic metastatic disease or other acute      findings.  4. Ultrasound of the scrotum May 15, 2007, negative for evidence of  testicular tumor or other abnormalities.  5. Ultrasound guided left supraclavicular lymph node core biopsies      May 17, 2007, path pending.  6. HIV testing - negative.   HOSPITAL COURSE:  Mr. Sebron Mcmahill is a very pleasant 35 year old  gentleman who was admitted to the hospital after presenting to the  emergency room on May 14, 2007, with complaints of severe shortness of  breath.  Chest x-ray revealed a very large left-sided pleural effusion.  CT scan of the chest was then carried out.  This revealed a 15.3 x 15.7  cm left anterior mediastinal mass and confirmed a large left pleural  effusion.  The patient was admitted to the acute unit.  Pulmonary  critical care consultation was carried out but pulmonary did not have  anything to offer.  Interventional radiology was consulted and a large  volume left-sided thoracentesis was carried out.  Fluid was consistent  with an exudate.  Cytology was sent but was unrevealing.  Physical exam  revealed no abnormalities of the scrotum/testicles.  Ultrasound of the  scrotum was carried out nonetheless.  This confirmed absence of mass  lesions or abnormalities.  CT scan of the abdomen and pelvis was also  carried out.  There was no evidence of other metastatic disease.  Oncology consultation was carried out.  The decision  was made to pursue  core biopsies of the patient's enlarged supraclavicular lymph nodes.  Interventional radiology carried this out on May 17, 2007, without  significant complication.  At the present time path results are pending.  Preliminary review by Dr. Arline Asp send resulted in him commenting that  this likely represents a lymphoma.  We, however, have not made a  definitive diagnosis at this time.  The patient is being preemptively  placed on allopurinol at 300 mg daily.  He is being set up for  outpatient PET scan under the direction of Dr. Arline Asp.  He is to be  contacted by Dr. Arline Asp for followup once his path results are  finalized and his PET scan is completed.  Dr. Arline Asp has discussed  with him the possibility of sperm banking prior to initiation of his  treatment.  The patient had not had any significant shortness of breath  or chest pain status post his thoracentesis.  He is clinically stable.   At the time of the patient's admission there had been a previous  diagnosis of a left knee area cellulitis which was felt to be community  acquired MRSA.  The patient was in the middle of a course of  doxycycline.  Clinically the patient's wounds were quite unimpressive.  They were less than a dime in diameter and located on the left knee and  the left lateral aspect of the leg in the region of the knee.  Doxycycline was continued during the hospital stay.  The date of his  discharge these lesions appeared to be inactive from an infectious  disease standpoint.  Antibiotics were therefore not felt to be necessary  from this point forward.   On May 18, 2007, the patient is cleared for discharge home with  followup at the cancer center as noted.   DISCHARGE LABS:  Beta-hCG is less than 0.5.  HIV antibody is  nonreactive.  TSH is 3.0.  Serum LDH is normal at 169.  Alpha-  fetoprotein is normal at 1.7.      Lonia Blood, M.D.  Electronically Signed      JTM/MEDQ  D:  05/18/2007  T:  05/18/2007  Job:  [id number]   cc:   Samul Dada, M.D.  Fax: [phone]

## 2010-06-15 NOTE — Consult Note (Signed)
NAME:  Garrett Bender, QUILES              ACCOUNT NO.:  [id number]   MEDICAL RECORD NO.:  [id number]          PATIENT TYPE:  INP   LOCATION:  1305                         [facility]ACILITY[facility]  [facility]   PHYSICIAN:  Samul Dada, M.D.DATE OF BIRTH:  02/10/1975   DATE OF CONSULTATION:  05/15/2007  DATE OF DISCHARGE:                                 CONSULTATION   REASON FOR CONSULTATION:  Large mediastinal mass.   REQUESTING PHYSICIAN:  Dr. Sherrie Mustache   HISTORY OF PRESENT ILLNESS:  Mr. Arnesen is a pleasant 35 year old white  male asked to see for evaluation of a large mediastinal mass, found as  he presented to [facility] emergency department with progressive  shortness of breath, and intermittent cough and fever since November  2008.  Prior to admission, the patient had been seen in urgent care,  where he was found to have this large pleural effusion.  The followup CT  of the chest on May 14, 2007, as an inpatient, showed the large soft  tissue mass to be 15.3 x 15.7 x 15.2 cm, extending along the take off of  the great vessels associated with left to right midline shift, a large  pleural effusion near complete collapse of the left lung.  No hilar  lymphadenopathy.  The right lung was clear.  Mild fatty focal  infiltration of the liver.  CT of the abdomen and pelvis on May 15, 2007, was negative for metastasis.  He had thoracentesis on May 15, 2007.  He is to possibly have a fine-needle aspiration of the mass as  per Dr. Sherene Sires.  LDH of the fluid from thoracentesis is 242, with white  count of 4400.  Alpha fetoprotein is 1.7.  His uric acid is 6.3.  LDH in  serum is 169.  We were asked to see him, anticipating malignancy.   PAST MEDICAL HISTORY:  1. Community acquired MRSA, as an outpatient, treated with      doxycycline, needing five more days of the medication.  2. Mild obesity.   SURGERIES:  None.   ALLERGIES:  NKDA.   MEDICATIONS:  1. Ventolin.  2. Doxycycline.  3. Atrovent.  4.  Protonix.  5. Senokot.  6. Tylenol.  7. Guaifenesin.  8. Dilaudid.  9. Atrovent.  10.Reglan.  11.Zofran.  12.Roxicodone.   REVIEW OF SYSTEMS:  Is remarkable for no fevers.  He has some chills and  night sweats for the last 4 months, complaining of a 15 pounds weight  loss over the last 6 months.  No headaches or confusion.  No double  vision or dysphagia.  He had shortness of breath for the last 5 months.  No chest pain.  He was noticed to have intermittent irregular heart  rate, per patient report.  No abdominal pain or other alert symptoms.  No nausea, vomiting or diarrhea.  No constipation.  He has early  satiety.  No hematuria or dysuria.  He has lower back pain, which was  pleuritic on admission.  No numbness or edema.  He has been overall  fatigued over the last few months.   FAMILY  HISTORY:  Mother alive and well, so is his father.  They are  accompanying him during the inpatient visit.   No tobacco or alcohol history.  Lives in [place].  Non-  denominational.  No drugs.  Owns a [employer].  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   PHYSICAL EXAMINATION:  This is a well-developed, well-nourished 35-year-  old white male in no acute distress, alert and oriented x3.  Blood  pressure 118/76, pulse 100, respirations 18, temperature 97.7, pulse  oximetry 99% on 2 liters, weight 119 pounds, height 6 feet 4 inches.  HEENT:  Normocephalic, atraumatic.  PERRL.  Oral mucosa without thrush  or lesions.  He wears glasses.  NECK:  Supple.  No cervical  lymphadenopathy.  He has a left supraclavicular lymph node palpable.  LUNGS:  Remarkable for decreased breath sounds on the left, the right  lung is clear.  No axillary masses.  CARDIOVASCULAR:  Regular rate and  rhythm without murmurs or rubs.  No gallops.  He has an occasional  ectopic beat.  ABDOMEN:  Soft, nontender.  Bowel sounds x4.  No hepatosplenomegaly.  EXTREMITIES:  Without edema.  No inguinal masses.   SKIN:  Showing nails brittle, the left lateral - knee area, shows some  erythema. The left knee showed a lesion with smaller erythematous  macules next to it.  This is where the MRSA was present earlier.  No  petechial rash.  No other areas of bruising.  GU and RECTAL:  Deferred.  MUSCULOSKELETAL:  With no spinal tenderness.  NEURO:  Nonfocal.   LABORATORY DATA:  Hemoglobin 13.1, hematocrit the 39.7, white count  17.1, platelets 328, MCV 78.7, ANC 14.5, monocytes 1.5, lymphocytes 0.7,  LDH 169, PTT 39.  PT 16.2, INR 1.3.  Sodium 138, potassium 3.8, BUN 13,  creatinine 0.95, glucose 88, total bilirubin 1.3, alkaline phosphatase  82, AST 17, ALT 18, total protein 7.5, albumin 3.1, calcium 9.3.   ASSESSMENT/PLAN:  Dr. Arline Asp has seen and evaluated the patient and  reviewed the chart.  This is a 35 year old man found to have a large  mediastinal mass, for which we were asked to see him in consultation.  The patient has a 2-3 cm left supraclavicular lymph node, no JVD.  This  lymph node is palpable on exam.  He has a history of night sweats  suggesting lymphoma, as most likely diagnosis.  Note, that his LDH is  normal and his alpha fetoprotein is normal as well.  Differential  includes derm cell tumor and thymoma. He needs tissue for precise  diagnosis, the more the better, either excisional biopsy of the left  supraclavicular lymph nodes or core needle biopsies, multiple of the  mediastinal mass or the supraclavicular lymph node.  If the biopsy  confirms lymphoma, the patient will need a  PET scan, bone marrow aspiration and biopsy, as well as HIV test.  Would  check a 2-D echo, to rule out pericardial effusion.  Dr. Arline Asp spoke  with the patient and his wife.  Thank you very much for allowing [place] the  opportunity to participate in the care of this nice patient.      Marlowe Kays, P.A.      Samul Dada, M.D.  Electronically Signed    SW/MEDQ  D:  05/17/2007  T:   05/17/2007  Job:  [id number]   cc:   Charlaine Dalton. Sherene Sires, MD, FCCP  [address]  [place] [address]

## 2010-06-15 NOTE — H&P (Signed)
NAME:  Yu, Lynwood              ACCOUNT NO.:  [id number]   MEDICAL RECORD NO.:  [id number]          PATIENT TYPE:  INP   LOCATION:  1305                         FACILITY:  [facility]   PHYSICIAN:  Raphael Gibney, MD        DATE OF BIRTH:  Jan 17, 1976   DATE OF ADMISSION:  05/14/2007  DATE OF DISCHARGE:                              HISTORY & PHYSICAL   CHIEF COMPLAINT:  Worsening shortness of breath, nonproductive cough of  5-6 months duration; weight loss of 15 pounds and night sweats of 4  months duration.   HISTORY OF PRESENT ILLNESS:  Mr. Denbleyker is a 35 year old Caucasian male  who was referred from an local Urgent Care Center to the [place]  Emergency Department after a chest x-ray noted a large thoracic mass. He  states he started to experience shortness of breath progressively over  the last 5-6 months. He also developed a nonproductive cough as well.  His shortness of breath was positional and got worse on laying down. It  also worsened on exertion. He had been to the Urgent Care 4-5 times in  the last 6 months with similar complaints. He was treated with multiple  rounds of antibiotics, but his symptoms did not improve. In retrospect,  he also notes that he had a weight loss of about 15 pounds in the last 4  months. He also gives a history of drenching in sweat at night of 3-4  months duration.   In the emergency department, a CT of the chest revealed the presence of  a large soft tissue mass situated in the anterior mediastinum to which  vessels course. The soft tissue mass extends along the take-off of the  great vessels. The mass measures 15.3 x 15.7cm with mass effect on the  lung and associated with a large left pleural effusion. There was also  an associated left to right mediastinal shift of structures. No  lymphadenopathy was noted.   PAST MEDICAL HISTORY:  Mr. Schaible denies any significant past medical  history of coronary artery disease, diabetes, kidney disease, or  any  particular childhood illnesses.   ALLERGIES:  NKDA.   MEDICATIONS:  The patient is currently not on any medicines.   SOCIAL HISTORY:  Denies any significant history of tobacco, alcohol, or  illicit drug use.   FAMILY HISTORY:  Does have a history of lung cancer in a grandfather.  Also history of hypertension and diabetes mellitus.   REVIEW OF SYSTEMS:  A detailed review of systems was performed and was  found to be the same as that mentioned in the HPI.   PHYSICAL EXAMINATION:  VITAL SIGNS:  Temperature 95.5, pulse 111,  respirations 12, blood pressure 122/58.  GENERAL:  Mr. Burkhead is a 35 year old Caucasian male who is alert and  oriented x4. He is in no acute distress.  HEENT:  Pupils are equal and reactive to light. Extraocular movements  are intact. No pallor. No icterus. No clearly palpable lymphadenopathy  in the cervical, axillary, or supraclavicular regions. Oropharynx is  clear without any lesions.  CHEST:  Decreased breath sounds  in the entire left side with stony  dullness on percussion in the left lower base. Tracheal shift noted to  the right side. Right side was mostly clear to auscultation.  HEART:  S1, S2 regular, mildly tachycardic.  ABDOMEN:  Soft, nontender. No clearly palpable masses or  hepatosplenomegaly. Bowel sounds are present.  GENITOURINARY:  Testicular examination was essentially normal with no  clearly palpable masses noted in either testes.  NEURO:  No obvious focal neurological deficits. Cranial nerves 2-12 are  grossly intact. Motor function is 5/5 upper limbs and lower limbs.  Sensory system is grossly intact.   LABORATORY DATA/RADIOLOGY:  WBC 19.1, hemoglobin 13.1, platelets 328,  sodium 138, potassium 3.8, chloride 100, CO2 26, glucose 88, BUN 13,  creatinine 0.95, AST 17, ALT 18, LDH 169, uric acid 6.3, alpha-  fetoprotein 1.7. CT of the chest with contrast revealed a large soft  tissue mass situated in the anterior mediastinum to which  vessels  course. This soft tissue mass extends along the take-off of the great  vessels. Mass measured approximately 15.3cm maximally in the AP diameter  x 15.7cm in the maximal transverse diameter. There was a large left  pleural effusion most likely malignant and near total collapse of the  entire left lung. No axillary lymphadenopathy was identified.   IMPRESSIONS:  1. Large anterior mediastinal soft tissue mass associated with left      pleural effusion. Differential diagnoses include in his age group      include germ-cell neoplasm versus a lymphoma.  2. Shortness of breath and cough.   PLAN:  1. We will admit the patient to the [facility] Inpatient [facility].  2. We will plan on getting a tissue diagnosis of the soft tissue mass,      will consult pulmonary for possible bronchoscopy.  3. We will consult hematology/oncology once the tissue diagnosis is      obtained.  4. We will complete imaging with a CT scan of the abdomen and pelvis      with and without contrast.  5. We will obtain tumor markers in the form of beta-HCG, LDH.  6. We will obtain a testicular ultrasound to rule out any masses.  7. We will provide continuous pulse-ox and nebulizers to help with      breathing and GI prophylaxis in the form of PPIs and DVT      prophylaxis: patient is ambulant.  8. Code status is full.      Raphael Gibney, MD  Electronically Signed     HV/MEDQ  D:  05/15/2007  T:  05/15/2007  Job:  [id number]

## 2010-06-18 ENCOUNTER — Other Ambulatory Visit (HOSPITAL_COMMUNITY): Payer: Self-pay | Admitting: Oncology

## 2010-06-18 ENCOUNTER — Encounter (HOSPITAL_BASED_OUTPATIENT_CLINIC_OR_DEPARTMENT_OTHER): Payer: Self-pay | Admitting: Oncology

## 2010-06-18 DIAGNOSIS — C8190 Hodgkin lymphoma, unspecified, unspecified site: Secondary | ICD-10-CM

## 2010-06-18 LAB — COMPREHENSIVE METABOLIC PANEL
ALT: 40 U/L (ref 0–53)
Albumin: 4.5 g/dL (ref 3.5–5.2)
CO2: 22 mEq/L (ref 19–32)
Chloride: 108 mEq/L (ref 96–112)
Glucose, Bld: 101 mg/dL — ABNORMAL HIGH (ref 70–99)
Potassium: 4.1 mEq/L (ref 3.5–5.3)
Sodium: 142 mEq/L (ref 135–145)
Total Protein: 6.7 g/dL (ref 6.0–8.3)

## 2010-06-18 LAB — CBC WITH DIFFERENTIAL/PLATELET
Eosinophils Absolute: 0.3 10*3/uL (ref 0.0–0.5)
MONO#: 0.4 10*3/uL (ref 0.1–0.9)
NEUT#: 3.8 10*3/uL (ref 1.5–6.5)
RBC: 4.86 10*6/uL (ref 4.20–5.82)
RDW: 13.6 % (ref 11.0–14.6)
WBC: 5.5 10*3/uL (ref 4.0–10.3)
lymph#: 0.8 10*3/uL — ABNORMAL LOW (ref 0.9–3.3)

## 2010-06-18 LAB — SEDIMENTATION RATE: Sed Rate: 4 mm/hr (ref 0–16)

## 2010-06-18 LAB — LACTATE DEHYDROGENASE: LDH: 151 U/L (ref 94–250)

## 2010-10-22 ENCOUNTER — Other Ambulatory Visit (HOSPITAL_COMMUNITY): Payer: Self-pay | Admitting: Oncology

## 2010-10-22 ENCOUNTER — Encounter (HOSPITAL_BASED_OUTPATIENT_CLINIC_OR_DEPARTMENT_OTHER): Payer: Self-pay | Admitting: Oncology

## 2010-10-22 DIAGNOSIS — C8190 Hodgkin lymphoma, unspecified, unspecified site: Secondary | ICD-10-CM

## 2010-10-22 DIAGNOSIS — C8111 Nodular sclerosis classical Hodgkin lymphoma, lymph nodes of head, face, and neck: Secondary | ICD-10-CM

## 2010-10-22 LAB — COMPREHENSIVE METABOLIC PANEL
ALT: 28 U/L (ref 0–53)
AST: 25 U/L (ref 0–37)
CO2: 23 mEq/L (ref 19–32)
Chloride: 107 mEq/L (ref 96–112)
Sodium: 140 mEq/L (ref 135–145)
Total Bilirubin: 0.9 mg/dL (ref 0.3–1.2)
Total Protein: 6.9 g/dL (ref 6.0–8.3)

## 2010-10-22 LAB — CBC WITH DIFFERENTIAL/PLATELET
BASO%: 0.4 % (ref 0.0–2.0)
EOS%: 2.8 % (ref 0.0–7.0)
MCH: 29.8 pg (ref 27.2–33.4)
MCHC: 34.1 g/dL (ref 32.0–36.0)
MCV: 87.4 fL (ref 79.3–98.0)
MONO%: 8.9 % (ref 0.0–14.0)
RBC: 4.83 10*6/uL (ref 4.20–5.82)
RDW: 12.9 % (ref 11.0–14.6)
lymph#: 1 10*3/uL (ref 0.9–3.3)

## 2010-10-26 LAB — CBC
HCT: 35.2 — ABNORMAL LOW
Hemoglobin: 11.9 — ABNORMAL LOW
MCHC: 33.1
MCV: 78.7
Platelets: 328
Platelets: 271
Platelets: 285
RBC: 4.64
RDW: 15.9 — ABNORMAL HIGH
RDW: 15.7 — ABNORMAL HIGH
RDW: 15.4
WBC: 11.4 — ABNORMAL HIGH

## 2010-10-26 LAB — BASIC METABOLIC PANEL
BUN: 7
BUN: 6
Calcium: 8.6
Creatinine, Ser: 0.78
GFR calc non Af Amer: >60
GFR calc non Af Amer: >60
Glucose, Bld: 113 — ABNORMAL HIGH
Glucose, Bld: 125 — ABNORMAL HIGH
Potassium: 3.3 — ABNORMAL LOW
Sodium: 137

## 2010-10-26 LAB — DIFFERENTIAL
Basophils Absolute: 0
Basophils Absolute: 0
Eosinophils Absolute: 0.2
Eosinophils Relative: 1
Eosinophils Relative: 2
Lymphocytes Relative: 4 — ABNORMAL LOW
Lymphocytes Relative: 4 — ABNORMAL LOW
Lymphocytes Relative: 6 — ABNORMAL LOW
Lymphs Abs: 0.7
Monocytes Absolute: 1.5 — ABNORMAL HIGH
Monocytes Absolute: 1.2 — ABNORMAL HIGH
Monocytes Relative: 10
Neutro Abs: 9.2 — ABNORMAL HIGH

## 2010-10-26 LAB — CULTURE, BLOOD (ROUTINE X 2): Culture: NO GROWTH

## 2010-10-26 LAB — COMPREHENSIVE METABOLIC PANEL
AST: 17
Albumin: 3.1 — ABNORMAL LOW
Calcium: 9.3
Creatinine, Ser: 0.95
GFR calc Af Amer: >60
GFR calc non Af Amer: >60
Total Protein: 7.5

## 2010-10-26 LAB — BODY FLUID CELL COUNT WITH DIFFERENTIAL
Lymphs, Fluid: 85
Monocyte-Macrophage-Serous Fluid: 6 — ABNORMAL LOW
Neutrophil Count, Fluid: 9
Total Nucleated Cell Count, Fluid: 4400 — ABNORMAL HIGH

## 2010-10-26 LAB — BODY FLUID CULTURE: Culture: NO GROWTH

## 2010-10-26 LAB — CHROMOSOME ANALYSIS, BONE MARROW

## 2010-10-26 LAB — PROTIME-INR: Prothrombin Time: 16.2 — ABNORMAL HIGH

## 2010-10-26 LAB — LACTATE DEHYDROGENASE, PLEURAL OR PERITONEAL FLUID: LD, Fluid: 242 — ABNORMAL HIGH

## 2010-10-26 LAB — TSH: TSH: 3.033

## 2010-10-26 LAB — PROTEIN, BODY FLUID

## 2010-10-29 LAB — GLUCOSE, CAPILLARY: Glucose-Capillary: 95

## 2011-01-15 ENCOUNTER — Telehealth: Payer: Self-pay | Admitting: Oncology

## 2011-01-15 NOTE — Telephone Encounter (Signed)
Talked to pt , gave him appt for CT, lab and MD, pt already has the oral contrast, instructed pt to be npo on day of CT 1/16th 2013 @ [facility]

## 2011-02-16 ENCOUNTER — Other Ambulatory Visit: Payer: Self-pay

## 2011-02-16 ENCOUNTER — Ambulatory Visit (HOSPITAL_COMMUNITY)
Admission: RE | Admit: 2011-02-16 | Discharge: 2011-02-16 | Disposition: A | Discharge status: Home / Self Care | Payer: Self-pay | Source: Ambulatory Visit | Attending: Oncology | Admitting: Oncology

## 2011-02-16 DIAGNOSIS — C8190 Hodgkin lymphoma, unspecified, unspecified site: Secondary | ICD-10-CM

## 2011-02-16 DIAGNOSIS — C8589 Other specified types of non-Hodgkin lymphoma, extranodal and solid organ sites: Secondary | ICD-10-CM | POA: Insufficient documentation

## 2011-02-16 DIAGNOSIS — R1909 Other intra-abdominal and pelvic swelling, mass and lump: Secondary | ICD-10-CM | POA: Insufficient documentation

## 2011-02-16 MED ORDER — IOHEXOL 300 MG/ML  SOLN
100.0000 mL | Freq: Once | INTRAMUSCULAR | Status: AC | PRN
  Administered 2011-02-16: 100 mL via INTRAVENOUS

## 2011-02-17 ENCOUNTER — Other Ambulatory Visit: Payer: Self-pay | Admitting: Lab

## 2011-02-18 ENCOUNTER — Encounter: Payer: Self-pay | Admitting: Oncology

## 2011-02-18 ENCOUNTER — Other Ambulatory Visit: Payer: Self-pay | Admitting: Lab

## 2011-02-18 ENCOUNTER — Ambulatory Visit (HOSPITAL_BASED_OUTPATIENT_CLINIC_OR_DEPARTMENT_OTHER): Payer: Self-pay | Admitting: Oncology

## 2011-02-18 ENCOUNTER — Telehealth: Payer: Self-pay | Admitting: Oncology

## 2011-02-18 VITALS — BP 113/72 | HR 78 | Temp 96.9°F | Ht 75.0 in | Wt 273.6 lb

## 2011-02-18 DIAGNOSIS — Z8579 Personal history of other malignant neoplasms of lymphoid, hematopoietic and related tissues: Secondary | ICD-10-CM | POA: Insufficient documentation

## 2011-02-18 DIAGNOSIS — Z8572 Personal history of non-Hodgkin lymphomas: Secondary | ICD-10-CM

## 2011-02-18 DIAGNOSIS — C8190 Hodgkin lymphoma, unspecified, unspecified site: Secondary | ICD-10-CM

## 2011-02-18 HISTORY — DX: Personal history of other malignant neoplasms of lymphoid, hematopoietic and related tissues: Z85.79

## 2011-02-18 HISTORY — DX: Personal history of non-Hodgkin lymphomas: Z85.72

## 2011-02-18 LAB — COMPREHENSIVE METABOLIC PANEL
ALT: 33 U/L (ref 0–53)
BUN: 18 mg/dL (ref 6–23)
CO2: 23 mEq/L (ref 19–32)
Calcium: 9.5 mg/dL (ref 8.4–10.5)
Chloride: 104 mEq/L (ref 96–112)
Creatinine, Ser: 0.82 mg/dL (ref 0.50–1.35)
Glucose, Bld: 130 mg/dL — ABNORMAL HIGH (ref 70–99)
Total Bilirubin: 0.7 mg/dL (ref 0.3–1.2)

## 2011-02-18 LAB — CBC WITH DIFFERENTIAL/PLATELET
Basophils Absolute: 0.1 10*3/uL (ref 0.0–0.1)
Eosinophils Absolute: 0.1 10*3/uL (ref 0.0–0.5)
HCT: 41.9 % (ref 38.4–49.9)
HGB: 14.5 g/dL (ref 13.0–17.1)
LYMPH%: 15.1 % (ref 14.0–49.0)
MCHC: 34.5 g/dL (ref 32.0–36.0)
MONO#: 0.5 10*3/uL (ref 0.1–0.9)
NEUT#: 4.6 10*3/uL (ref 1.5–6.5)
NEUT%: 74.9 % (ref 39.0–75.0)
Platelets: 178 10*3/uL (ref 140–400)
WBC: 6.1 10*3/uL (ref 4.0–10.3)
lymph#: 0.9 10*3/uL (ref 0.9–3.3)

## 2011-02-18 LAB — LACTATE DEHYDROGENASE: LDH: 189 U/L (ref 94–250)

## 2011-02-18 NOTE — Progress Notes (Signed)
This office note has been dictated.  #[id number]

## 2011-02-18 NOTE — Telephone Encounter (Signed)
gv pt appt schedule for may.  °

## 2011-02-18 NOTE — Progress Notes (Signed)
CC:   Lonia Blood, M.D. Radene Gunning, M.D., Ph.D. [name], M.D.  HISTORY:  I saw Garrett Bender today for followup of his Hodgkin's lymphoma, stage II with huge mediastinal mass as well as cervical and supraclavicular lymphadenopathy dating back to April 2009.  The patient completed 6-1/2 cycles of ABVD chemotherapy with an excellent response. Chemotherapy was completed on 11/09/2007 and radiation treatments were subsequently given.  The patient was last seen by [place] on 10/22/2010.  He continues to do well with no symptoms to suggest recurrent disease.  He denies any fever, chills, night sweats, any pain or any difficulty breathing.  He denies any sense of ill health.  He underwent CT scans of chest, abdomen and pelvis with oral and IV contrast on 02/16/2011.  There were no significant changes.  PROBLEM LIST: 1. 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  The patient's symptoms started in October of 2008 but      he did not seek medical attention because he did not have      medical insurance.  The patient had a     large left pleural effusion.  He received 6-1/2 cycles of ABVD     chemotherapy with an excellent response.  Chemotherapy was given     from 05/25/2007 through 11/09/2007.  He then received radiation     treatments 3960 centigray from 11/26/2007 through 12/25/2007.  He     has remained in complete clinical remission since then.  Most     recent CT scans of chest, abdomen and pelvis with oral and IV     contrast were carried out on 02/16/2011. 2. Recurrent staphylococcal folliculitis. 3. Obesity.  MEDICATIONS:  The patient is on no medicines at this time.  PHYSICAL EXAMINATION:  General:  Garrett Bender looks well.  There is no obvious change.  Vital signs:  Weight is 273 pounds 9.6  ounces.  Height 63 inches.  Body surface area 2.56 meters squared.  Blood pressure 113/72.  Other vital signs are normal.  There is no scleral icterus. Mouth and pharynx are benign.  No peripheral adenopathy palpable in the neck, supraclavicular, axillary or inguinal areas.  Heart and lungs were normal.  Abdomen is obese, nontender with no organomegaly, masses palpable.  Extremities:  No peripheral edema or clubbing.  No central catheter or port.  Neurologic exam is grossly normal.  LABORATORY DATA:  Today, white count 6.1, ANC 4.6, hemoglobin 14.5, hematocrit 41.9, platelets 178,000.  Chemistries and sedimentation rate today are pending.  Chemistries from 10/22/2010 were completely normal. Sedimentation rate was 5.  The patient's last TSH was 3.349 on 01/22/2010.  IMAGING STUDIES: 1. CT scan of the chest with IV contrast on 05/13/2009 showed an     interval decrease in the size of the anterior mediastinal mass,     interval resolution of radiation changes in the lungs and a small 3     mm pulmonary nodule right lower lobe. 2. PET scan on 01/27/2010 showed no evidence for hypermetabolic     disease to suggest metabolically active lymphoma.  The soft tissue     density in the anterior mediastinum has decreased in size in the     interval measuring 2.9 x 5.0 cm as compared with 3.3 x 5.7 cm  from     the scan of 09/12/2008. 3. CT scan of chest, abdomen and pelvis with oral and IV contrast on     02/16/2011 showed the anterior mediastinal soft tissue density     which was partially calcified measuring 2.9 x 5.2 cm on image 17.     This measured 2.9 x 5.0 cm on a scan carried out on 01/27/2010.     The rest of the scans are negative.  IMPRESSION AND PLAN:  Garrett Bender is now approaching 4 years from the time of diagnosis with no active disease or suggestion of recurrent Hodgkin's lymphoma.  He continues to do well.  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  At the present time he and his wife do not have health insurance.  He is hoping to obtain that in the next month or so.  We will plan to see Garrett Bender again in 4 months which will be mid May. We will check CBC, chemistries, sedimentation rate and TSH level at that time.    ______________________________ Samul Dada, M.D. DSM/MEDQ  D:  02/18/2011  T:  02/18/2011  Job:  [id number]

## 2011-02-19 LAB — SEDIMENTATION RATE: Sed Rate: 12 mm/hr (ref 0–16)

## 2011-06-17 ENCOUNTER — Ambulatory Visit (HOSPITAL_BASED_OUTPATIENT_CLINIC_OR_DEPARTMENT_OTHER): Payer: BC Managed Care – PPO | Admitting: Oncology

## 2011-06-17 ENCOUNTER — Encounter: Payer: Self-pay | Admitting: Oncology

## 2011-06-17 ENCOUNTER — Telehealth: Payer: Self-pay | Admitting: Oncology

## 2011-06-17 ENCOUNTER — Other Ambulatory Visit (HOSPITAL_BASED_OUTPATIENT_CLINIC_OR_DEPARTMENT_OTHER): Payer: BC Managed Care – PPO | Admitting: Lab

## 2011-06-17 VITALS — BP 115/78 | HR 76 | Temp 98.7°F | Ht 75.0 in | Wt 274.2 lb

## 2011-06-17 DIAGNOSIS — H8110 Benign paroxysmal vertigo, unspecified ear: Secondary | ICD-10-CM

## 2011-06-17 DIAGNOSIS — C8198 Hodgkin lymphoma, unspecified, lymph nodes of multiple sites: Secondary | ICD-10-CM

## 2011-06-17 DIAGNOSIS — C8190 Hodgkin lymphoma, unspecified, unspecified site: Secondary | ICD-10-CM

## 2011-06-17 LAB — CBC WITH DIFFERENTIAL/PLATELET
Basophils Absolute: 0 10*3/uL (ref 0.0–0.1)
EOS%: 2.9 % (ref 0.0–7.0)
Eosinophils Absolute: 0.2 10*3/uL (ref 0.0–0.5)
HGB: 14.6 g/dL (ref 13.0–17.1)
MCH: 29.5 pg (ref 27.2–33.4)
MCV: 87.3 fL (ref 79.3–98.0)
MONO%: 8.8 % (ref 0.0–14.0)
NEUT#: 4.1 10*3/uL (ref 1.5–6.5)
RBC: 4.94 10*6/uL (ref 4.20–5.82)
RDW: 13.5 % (ref 11.0–14.6)
lymph#: 1.2 10*3/uL (ref 0.9–3.3)
nRBC: 0 % (ref 0–0)

## 2011-06-17 NOTE — Progress Notes (Signed)
This office note has been dictated.  #[id number]

## 2011-06-17 NOTE — Telephone Encounter (Signed)
appts made and printed for pt aom °

## 2011-06-17 NOTE — Progress Notes (Signed)
CC:   Lonia Blood, M.D. Radene Gunning, M.D., Ph.D. [name], M.D.  PROBLEM LIST:  1. Hodgkin's lymphoma stage II with huge mediastinal mass, cervical  and supraclavicular lymphadenopathy involving the left side of the  neck diagnosed by a biopsy of a left supraclavicular lymph node on  05/17/2007. The patient's symptoms started in October of 2008 but  he did not seek medical attention because he did not have  medical insurance. The patient had a  large left pleural effusion. He received 6-1/2 cycles of ABVD  chemotherapy with an excellent response. Chemotherapy was given  from 05/25/2007 through 11/09/2007. He then received radiation  treatments 3960 centigray from 11/26/2007 through 12/25/2007. He  has remained in complete clinical remission since then. Most  recent CT scans of chest, abdomen and pelvis with oral and IV  contrast were carried out on 02/16/2011.  2. Recurrent staphylococcal folliculitis.  3. Obesity. 4. Vertigo, which started early May 2013 and tends to occur when the     patient is lying on his right side.  He is being exposed to loud     machine noises at work.  MEDICATIONS:  No prescription medicine.  He takes vitamin C occasionally.  HISTORY:  Ayub Kirsh was seen today for followup of his Hodgkin lymphoma, stage II with huge mediastinal mass as well as cervical and supraclavicular lymphadenopathy dating back to April 2009.  Carnel was last seen by [place] on 02/18/2011.  There were no symptoms to suggest recurrent Hodgkin lymphoma.  Several weeks ago, he started working.  He now has [employer].  He is working for [employer].  There are no fevers, chills, night sweats, any difficulty breathing.  He is exposed to loud noises.  He uses earplugs.  We talked about perhaps using more protection in the form of covering headphones as well.  PHYSICAL EXAMINATION:  The patient looks well.  Weight is stable at 274.2 pounds.   Height 6 feet 3 inches.  Body surface area 2.57 sq. m. Blood pressure 115/78.  Other vital signs are normal.  O2 saturation on room air at rest was 97%.  There is no scleral icterus.  Mouth and pharynx are benign.  No peripheral adenopathy palpable in any of the lymph node groups.  Heart/lungs:  Normal.  Abdomen:  Obese, nontender, with no organomegaly or masses palpable.  Extremities:  No peripheral edema or clubbing.  No central catheter or port.  Neurologic:  Normal.  LABORATORY DATA:  Today, white count 6.0, ANC 4.1, hemoglobin 14.6, hematocrit 43.1, platelets 160,000.  His chemistries, TSH and sed rate today are pending.  Chemistries from 02/18/2011 were normal except for a glucose of 130.  LDH 189.  Albumin 4.1.  TSH today is pending.  The last TSH that we have on record was from 01/22/2010 and was 3.349.  Sed rate from 02/18/2011 was 12.  IMAGING STUDIES:  1. CT scan of the chest with IV contrast on 05/13/2009 showed an  interval decrease in the size of the anterior mediastinal mass,  interval resolution of radiation changes in the lungs and a small 3  mm pulmonary nodule right lower lobe.  2. PET scan on 01/27/2010 showed no evidence for hypermetabolic  disease to suggest metabolically active lymphoma. The soft tissue  density in the anterior mediastinum has decreased in size in the  interval measuring 2.9 x 5.0 cm as compared with 3.3 x 5.7 cm from  the scan of 09/12/2008.  3.  CT scan of chest, abdomen and pelvis with oral and IV contrast on  02/16/2011 showed the anterior mediastinal soft tissue density  which was partially calcified measuring 2.9 x 5.2 cm on image 17.  This measured 2.9 x 5.0 cm on a scan carried out on 01/27/2010.  The rest of the scans are negative.    IMPRESSION AND PLAN:  Edmar is now out 4 years from the time of diagnosis.  There is no active disease or evidence of a recurrent Hodgkin lymphoma.  As stated, his last CT scans of chest, abdomen,  and pelvis were carried out on 02/16/2011.  We will plan to see Caius again in 4 months, at which time we will check CBC, chemistries, TSH and sed rate.  We will consider repeating his CT scans in January of 2014.  Jet will try to a protect his hearing from the loud noises that he is currently being exposed to.  If he continues to have vertigo, then he will need to see an ENT specialist.  At the moment, he has vertigo and some nausea when he tries to lie on his right side.    ______________________________ Samul Dada, M.D. DSM/MEDQ  D:  06/17/2011  T:  06/17/2011  Job:  [id number]

## 2011-06-18 LAB — COMPREHENSIVE METABOLIC PANEL
ALT: 21 U/L (ref 0–53)
Albumin: 4.3 g/dL (ref 3.5–5.2)
Alkaline Phosphatase: 45 U/L (ref 39–117)
Potassium: 4 mEq/L (ref 3.5–5.3)
Sodium: 140 mEq/L (ref 135–145)
Total Bilirubin: 0.9 mg/dL (ref 0.3–1.2)
Total Protein: 6.7 g/dL (ref 6.0–8.3)

## 2011-10-21 ENCOUNTER — Telehealth: Payer: Self-pay | Admitting: Oncology

## 2011-10-21 ENCOUNTER — Ambulatory Visit (HOSPITAL_BASED_OUTPATIENT_CLINIC_OR_DEPARTMENT_OTHER): Payer: BC Managed Care – PPO | Admitting: Oncology

## 2011-10-21 ENCOUNTER — Encounter: Payer: Self-pay | Admitting: Oncology

## 2011-10-21 ENCOUNTER — Other Ambulatory Visit (HOSPITAL_BASED_OUTPATIENT_CLINIC_OR_DEPARTMENT_OTHER): Payer: BC Managed Care – PPO

## 2011-10-21 VITALS — BP 116/76 | HR 89 | Temp 97.8°F | Resp 20 | Ht 75.0 in | Wt 279.3 lb

## 2011-10-21 DIAGNOSIS — C8190 Hodgkin lymphoma, unspecified, unspecified site: Secondary | ICD-10-CM

## 2011-10-21 DIAGNOSIS — C8198 Hodgkin lymphoma, unspecified, lymph nodes of multiple sites: Secondary | ICD-10-CM

## 2011-10-21 DIAGNOSIS — Z23 Encounter for immunization: Secondary | ICD-10-CM

## 2011-10-21 LAB — COMPREHENSIVE METABOLIC PANEL (CC13)
AST: 25 U/L (ref 5–34)
Albumin: 4.1 g/dL (ref 3.5–5.0)
BUN: 16 mg/dL (ref 7.0–26.0)
CO2: 23 mEq/L (ref 22–29)
Calcium: 9.5 mg/dL (ref 8.4–10.4)
Chloride: 108 mEq/L — ABNORMAL HIGH (ref 98–107)
Glucose: 133 mg/dl — ABNORMAL HIGH (ref 70–99)
Potassium: 3.9 mEq/L (ref 3.5–5.1)

## 2011-10-21 LAB — CBC WITH DIFFERENTIAL/PLATELET
BASO%: 0.9 % (ref 0.0–2.0)
HCT: 41.8 % (ref 38.4–49.9)
LYMPH%: 22.3 % (ref 14.0–49.0)
MCH: 29.8 pg (ref 27.2–33.4)
MCHC: 33.8 g/dL (ref 32.0–36.0)
MONO#: 0.4 10*3/uL (ref 0.1–0.9)
NEUT%: 66.5 % (ref 39.0–75.0)
Platelets: 159 10*3/uL (ref 140–400)
WBC: 5.6 10*3/uL (ref 4.0–10.3)

## 2011-10-21 LAB — LACTATE DEHYDROGENASE (CC13): LDH: 190 U/L (ref 125–220)

## 2011-10-21 MED ORDER — PNEUMOCOCCAL VAC POLYVALENT 25 MCG/0.5ML IJ INJ
0.5000 mL | INJECTION | Freq: Once | INTRAMUSCULAR | Status: AC
  Administered 2011-10-21: 0.5 mL via INTRAMUSCULAR
  Filled 2011-10-21: qty 0.5

## 2011-10-21 MED ORDER — INFLUENZA VIRUS VACC SPLIT PF IM SUSP
0.5000 mL | Freq: Once | INTRAMUSCULAR | Status: AC
  Administered 2011-10-21: 0.5 mL via INTRAMUSCULAR
  Filled 2011-10-21: qty 0.5

## 2011-10-21 NOTE — Progress Notes (Signed)
This office note has been dictated.  #[id number]

## 2011-10-21 NOTE — Progress Notes (Signed)
CC:   Garrett Bender, M.D. Radene Gunning, M.D., Ph.D. [name], M.D.  PROBLEM LIST:  1. Hodgkin's lymphoma stage II with huge mediastinal mass, cervical  and supraclavicular lymphadenopathy involving the left side of the  neck diagnosed by a biopsy of a left supraclavicular lymph node on  05/17/2007. The patient's symptoms started in October of 2008 but  he did not seek medical attention because he did not have  medical insurance. The patient had a  large left pleural effusion. He received 6-1/2 cycles of ABVD  chemotherapy with an excellent response. Chemotherapy was given  from 05/25/2007 through 11/09/2007. He then received radiation  treatments 3960 centigray from 11/26/2007 through 12/25/2007. He  has remained in complete clinical remission since then. Most  recent CT scans of chest, abdomen and pelvis with oral and IV  contrast were carried out on 02/16/2011.  2. Recurrent staphylococcal folliculitis.  3. Obesity.  4. History of vertigo from May 2013.   MEDICATIONS: 1. Mupirocin ointment (Bactroban) 2% applied to hands 1-3 times daily. 2. Triamcinolone (Kenalog) 0.025% cream applied topically twice daily.  The patient will receive Pneumovax and his flu shot today, 10/21/2011.   SMOKING HISTORY:  The patient has never smoked cigarettes.   HISTORY:  I saw Garrett Bender today for followup of his Hodgkin lymphoma stage II with huge mediastinal mass, as well as cervical and supraclavicular lymphadenopathy.  His diagnosis dates back to April 2009.  Garrett Bender was last seen by [place] on 06/17/2011.  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  He frequently works the night shift.  His main problem has been some Staph infections of his hands, which he attributes to wearing gloves and also eating bread.  He denies any  symptoms to suggest recurrent Hodgkin disease.  He denies any respiratory symptoms or pain.  In general, he feels well.  He has had no further episodes of vertigo.  PHYSICAL EXAMINATION:  General:  Garrett Bender looks well.  Vital Signs: Weight is 279.2 pounds height 63 inches, body surface area 2.59 sq m. Bender pressure 116/76.  Other vital signs are normal.  HEENT:  There is no scleral icterus.  Mouth and pharynx are benign.  There is no peripheral adenopathy palpable in the neck, supraclavicular, axillary, or inguinal areas.  Heart and Lungs:  Normal.  Abdomen : Somewhat obese, nontender with no organomegaly or masses palpable.  Extremities:  No peripheral edema or clubbing.  No obvious infection on his hands at this time.  No central catheter or port.  Neurologic exam:  Normal.  LABORATORY DATA:  Today white count 5.6, ANC 3.7, hemoglobin 14.1, hematocrit 41.8, platelets 159,000.  Chemistries notable for a glucose of 133.  Albumin 4.1, LDH 190.  Sed rate and TSH are pending. Chemistries from 06/17/2011 were normal except for a glucose of 107. Sed rate was 4 and TSH was 5.292 with normal being 0.350 to 4.500.  As stated, we are checking TSH today.  Garrett Bender does not have a primary care physician and thus, we may need to put him on a small dose of levothyroxine.  IMAGING STUDIES:  1. CT scan of the chest with IV contrast on 05/13/2009 showed an  interval decrease in the size of the anterior mediastinal mass,  interval resolution of radiation changes in the lungs and a small 3  mm  pulmonary nodule right lower lobe.  2. PET scan on 01/27/2010 showed no evidence for hypermetabolic  disease to suggest metabolically active lymphoma. The soft tissue  density in the anterior mediastinum has decreased in size in the  interval measuring 2.9 x 5.0 cm as compared with 3.3 x 5.7 cm from  the scan of 09/12/2008.  3. CT scan of chest, abdomen and pelvis with oral and IV contrast on  02/16/2011  showed the anterior mediastinal soft tissue density  which was partially calcified measuring 2.9 x 5.2 cm on image 17.  This measured 2.9 x 5.0 cm on a scan carried out on 01/27/2010.  The rest of the scans are negative.   IMPRESSION AND PLAN:  Garrett Bender is now out about 4-1/2 years from the time of diagnosis without evidence of disease recurrence.  Garrett Bender was going to receive Pneumovax and a flu shot today.  He does not think that he has ever had Pneumovax before.  We talked about his staph infections involving his hands.  If this continues despite the treatment and suggestions that we talked about, Garrett Bender will need to see Dr. Danella Deis or Dr. [name].  He saw them in the past when he was having staph infections involving his abdomen and groin area.  We will plan to see Garrett Bender again in 4 months, which will be around January 20th, at which time we will check CBC, chemistries, LDH, sed rate, and TSH.  We are also scheduling Garrett Bender for a chest CT scan with IV contrast the week before.  Garrett Bender may want to have this carried out in early November due to financial considerations.  He will check into this and let us know if doing the CT scan a couple of months earlier would be advantageous to him.    ______________________________ Samul Dada, M.D. DSM/MEDQ  D:  10/21/2011  T:  10/21/2011  Job:  [id number]

## 2011-10-21 NOTE — Telephone Encounter (Signed)
gve the pt his jan 2014 appt calendar °

## 2012-02-13 ENCOUNTER — Ambulatory Visit (HOSPITAL_COMMUNITY)
Admission: RE | Admit: 2012-02-13 | Discharge: 2012-02-13 | Disposition: A | Discharge status: Home / Self Care | Payer: BC Managed Care – PPO | Source: Ambulatory Visit | Attending: Oncology | Admitting: Oncology

## 2012-02-13 DIAGNOSIS — R222 Localized swelling, mass and lump, trunk: Secondary | ICD-10-CM | POA: Insufficient documentation

## 2012-02-13 DIAGNOSIS — C8190 Hodgkin lymphoma, unspecified, unspecified site: Secondary | ICD-10-CM | POA: Insufficient documentation

## 2012-02-13 MED ORDER — IOHEXOL 300 MG/ML  SOLN
80.0000 mL | Freq: Once | INTRAMUSCULAR | Status: AC | PRN
  Administered 2012-02-13: 80 mL via INTRAVENOUS

## 2012-02-20 ENCOUNTER — Ambulatory Visit: Payer: BC Managed Care – PPO | Admitting: Oncology

## 2012-02-20 ENCOUNTER — Other Ambulatory Visit: Payer: BC Managed Care – PPO | Admitting: Lab

## 2012-02-21 ENCOUNTER — Encounter: Payer: Self-pay | Admitting: Family

## 2012-02-21 ENCOUNTER — Ambulatory Visit (HOSPITAL_BASED_OUTPATIENT_CLINIC_OR_DEPARTMENT_OTHER): Payer: BC Managed Care – PPO | Admitting: Family

## 2012-02-21 ENCOUNTER — Other Ambulatory Visit (HOSPITAL_BASED_OUTPATIENT_CLINIC_OR_DEPARTMENT_OTHER): Payer: BC Managed Care – PPO | Admitting: Lab

## 2012-02-21 VITALS — BP 118/84 | HR 99 | Temp 97.0°F | Resp 20 | Ht 75.0 in | Wt 287.2 lb

## 2012-02-21 DIAGNOSIS — C8190 Hodgkin lymphoma, unspecified, unspecified site: Secondary | ICD-10-CM

## 2012-02-21 DIAGNOSIS — C8198 Hodgkin lymphoma, unspecified, lymph nodes of multiple sites: Secondary | ICD-10-CM

## 2012-02-21 DIAGNOSIS — B958 Unspecified staphylococcus as the cause of diseases classified elsewhere: Secondary | ICD-10-CM

## 2012-02-21 LAB — COMPREHENSIVE METABOLIC PANEL (CC13)
ALT: 52 U/L (ref 0–55)
AST: 24 U/L (ref 5–34)
Alkaline Phosphatase: 50 U/L (ref 40–150)
Creatinine: 1 mg/dL (ref 0.7–1.3)
Sodium: 142 mEq/L (ref 136–145)
Total Bilirubin: 1.15 mg/dL (ref 0.20–1.20)

## 2012-02-21 LAB — CBC WITH DIFFERENTIAL/PLATELET
BASO%: 0.9 % (ref 0.0–2.0)
LYMPH%: 18.8 % (ref 14.0–49.0)
MCHC: 33.2 g/dL (ref 32.0–36.0)
MCV: 86.9 fL (ref 79.3–98.0)
MONO%: 8.2 % (ref 0.0–14.0)
Platelets: 177 10*3/uL (ref 140–400)
RBC: 5.1 10*6/uL (ref 4.20–5.82)
RDW: 13.5 % (ref 11.0–14.6)
WBC: 7.3 10*3/uL (ref 4.0–10.3)

## 2012-02-21 LAB — LACTATE DEHYDROGENASE (CC13): LDH: 179 U/L (ref 125–245)

## 2012-02-21 NOTE — Patient Instructions (Addendum)
Please contact us at [phone] if you have any questions or concerns.  Find a primary care physician Eat bananas and drink orange juice daily for 7 days for your low potassium.

## 2012-02-21 NOTE — Progress Notes (Signed)
Patient ID: Garrett Bender, male   DOB: Oct 13, 1975, 37 y.o.   MRN: [id number] CSN: [id number]  CC: Lonia Blood, MD  Radene Gunning, MD, Ph.D.  [name], MD  Problem List: Garrett Bender is a 37 y.o. Caucasian male with a problem list consisting of:  1. Hodgkin's lymphoma stage II with a very large mediastinal mass, cervical and supraclavicular lymphadenopathy involving the left side of the neck diagnosed by a biopsy of a left supraclavicular lymph node on 05/17/2007. The patient's symptoms started in October of 2008 but he did not seek medical attention because he did not have medical insurance. The patient had a large left pleural effusion. He received 6-1/2 cycles of ABVD chemotherapy with an excellent response. Chemotherapy was given from 05/25/2007 through 11/09/2007. He then received radiation treatments 3960 centigray from 11/26/2007 through 12/25/2007. He has remained in complete clinical remission since then. Last CT of the abdomen/pelvis was on 02/16/2011. Most recent CT scans of chest with oral and IV contrast was carried out on 02/13/2012.  2. Recurrent staphylococcal folliculitis 3. Obesity 4. History of vertigo from May 2013 5. Pneumovax and influenza vaccinations on 10/21/2011  Dr. Arline Asp and I saw Garrett Bender today for follow up of his Hodgkin's lymphoma stage II with a very large mediastinal mass, as well as cervical and supraclavicular lymphadenopathy.  He is accompanied by his wife [name] for today's office visit.  Mr. Gaffey was last seen by [place] on 10/21/2011.  His diagnosis dates back to April 2009. 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 He frequently works the night shift.   Since his last office visit, Mr. Keltner developed a Staph infection of his left index finger, which he attributes to wearing  gloves at work and also after touching bread. Soon thereafter, Mr. Scalise reports that he had a "cold" that started around 01/27/2012 and he still has a lingering productive cough with white phlegm.  He stated that his URI infection symptoms have mostly resolve except for the cough.   He denies any other symptomatology including any symptoms to suggest recurrent Hodgkin disease. He denies any significant respiratory symptoms (other than a cough) or pain.  In general, he feels well. He has had no further episodes of vertigo.  The results of the CT scan of the chest on 02/13/2012 was discussed with the patient and his wife and they were provided a copy of the scan.    Past Medical History: Past Medical History  Diagnosis Date  . Hodgkin's lymphoma     2009    Surgical History: History reviewed. No pertinent past surgical history.  Current Medications: Current Outpatient Prescriptions  Medication Sig Dispense Refill  . mupirocin ointment (BACTROBAN) 2 % Apply 1 application topically 3 (three) times daily. To backs of fingers      . triamcinolone (KENALOG) 0.025 % cream Apply topically 2 (two) times daily. Bid to tid for 14 days to backs of fingers        Allergies: No Known Allergies  Family History: Family History  Problem Relation Age of Onset  . Diabetes Father   . Heart attack Father 52  . Diabetes Maternal Grandfather     Social History: History  Substance Use Topics  . Smoking status: Never Smoker   . Smokeless tobacco: Never Used  . Alcohol Use: No  Review of Systems: 10 Point review of systems was completed and is negative except as noted above.   Physical Exam:   Blood pressure 118/84, pulse 99, temperature 97 F (36.1 C), temperature source Oral, resp. rate 20, height 6\' 3"  (1.905 m), weight 287 lb 3.2 oz (130.273 kg).  O2 sats 97% on RA.  General appearance: Alert, cooperative, well nourished, no apparent distress Head: Normocephalic, without obvious  abnormality, atraumatic Eyes: Conjunctivae/corneas clear, PERRLA, EOMI Nose: Nares, septum and mucosa are normal, no drainage or sinus tenderness. Neck: Left supraclavicular fullness, supple, symmetrical, trachea midline, thyroid not enlarged, no tenderness Resp: Clear to auscultation bilaterally Cardio: Regular rate and rhythm, S1, S2 normal, no murmur, click, rub or gallop GI: Soft, distended, non-tender, hypoactive bowel sounds, no organomegaly Extremities: Extremities normal, atraumatic, no cyanosis or edema, no current staph infection Lymph nodes: Cervical and axillary nodes normal, left supraclavicular fullness Neurologic: Grossly normal    Laboratory Data: Results for orders placed in visit on 02/21/12 (from the past 48 hour(s))  CBC WITH DIFFERENTIAL     Status: Normal   Collection Time   02/21/12  3:22 PM      Component Value Range Comment   WBC 7.3  4.0 - 10.3 10e3/uL    NEUT# 5.0  1.5 - 6.5 10e3/uL    HGB 14.7  13.0 - 17.1 g/dL    HCT 16.1  09.6 - 04.5 %    Platelets 177  140 - 400 10e3/uL    MCV 86.9  79.3 - 98.0 fL    MCH 28.9  27.2 - 33.4 pg    MCHC 33.2  32.0 - 36.0 g/dL    RBC 4.09  8.11 - 9.14 10e6/uL    RDW 13.5  11.0 - 14.6 %    lymph# 1.4  0.9 - 3.3 10e3/uL    MONO# 0.6  0.1 - 0.9 10e3/uL    Eosinophils Absolute 0.3  0.0 - 0.5 10e3/uL    Basophils Absolute 0.1  0.0 - 0.1 10e3/uL    NEUT% 67.8  39.0 - 75.0 %    LYMPH% 18.8  14.0 - 49.0 %    MONO% 8.2  0.0 - 14.0 %    EOS% 4.3  0.0 - 7.0 %    BASO% 0.9  0.0 - 2.0 %   LACTATE DEHYDROGENASE (CC13)     Status: Normal   Collection Time   02/21/12  3:22 PM      Component Value Range Comment   LDH 179  125 - 245 U/L   COMPREHENSIVE METABOLIC PANEL (CC13)     Status: Abnormal   Collection Time   02/21/12  3:22 PM      Component Value Range Comment   Sodium 142  136 - 145 mEq/L    Potassium 3.4 (*) 3.5 - 5.1 mEq/L    Chloride 106  98 - 107 mEq/L    CO2 24  22 - 29 mEq/L    Glucose 113 (*) 70 - 99 mg/dl    BUN  78.2  7.0 - 95.6 mg/dL    Creatinine 1.0  0.7 - 1.3 mg/dL    Total Bilirubin 2.13  0.20 - 1.20 mg/dL    Alkaline Phosphatase 50  40 - 150 U/L    AST 24  5 - 34 U/L    ALT 52  0 - 55 U/L    Total Protein 7.3  6.4 - 8.3 g/dL    Albumin 3.9  3.5 - 5.0 g/dL  Calcium 9.5  8.4 - 10.4 mg/dL      Imaging Studies: 1. CT scan of the chest with IV contrast on 05/13/2009 showed an interval decrease in the size of the anterior mediastinal mass, interval resolution of radiation changes in the lungs and a small 3 mm pulmonary nodule right lower lobe.  2. PET scan on 01/27/2010 showed no evidence for hypermetabolic disease to suggest metabolically active lymphoma. The soft tissue  density in the anterior mediastinum has decreased in size in the interval measuring 2.9 x 5.0 cm as compared with 3.3 x 5.7 cm from  the scan of 09/12/2008.  3. CT scan of chest, abdomen and pelvis with oral and IV contrast on 02/16/2011 showed the anterior mediastinal soft tissue density  which was partially calcified measuring 2.9 x 5.2 cm on image 17. This measured 2.9 x 5.0 cm on a scan carried out on 01/27/2010.  The rest of the scans are negative. 4.  CT scan of the chest with contrast on 02/13/2012 showed  the anterior mediastinal mass measures slightly smaller today when measuring at the same level and in the same dimensions and which was measured previously.  The lesion measures 2.3 x 4.5 cm today compared 2.9 x 5.2 cm previously.  There is some central dystrophic calcification within the lesion.  No other mediastinal or hilar lymphadenopathy. Heart size is normal.  There is no pericardial or pleural effusion.  Bone windows show no parenchymal nodular mass.  There is no focal airspace consolidation.  Subtle ground-glass attenuation in the posterior left upper lobe may be related to an infectious or inflammatory alveolitis.  Bone windows reveal no worrisome lytic or sclerotic osseous lesions.  IMPRESSION: Interval decrease in  the anterior mediastinal mass with central dystrophic calcification.  Imaging features are compatible with treated lymphoma.  Subtle small area of ground-glass attenuation in the posterior left upper lobe may be related to infectious or inflammatory alveolitis.   Impression/Plan: Mr. Daquon Greenleaf is now out almost 5 years from the time of diagnosis without evidence of disease recurrence. Dr. Arline Asp has talked about his recurrent staph infections involving his hands. If this continues despite the treatment and suggestions that we talked about, Mr. Boodram will need to see Dr. Danella Deis or Dr. [name]. The patient has seen them in the past when he was having staph infections involving his abdomen and groin area.   We will plan to see Mr. Fecteau again in 6 months, which will be around 08/23/2012, at which time we will check CBC, chemistries, LDH, Sed rate and TSH. TSH and Sed rate drawn today are pending. The patient was asked to find a primary care physician.  Mr. Bernard was also asked to consume bananas and drink orange juice daily for the next 7 days for his low potassium.  Mr. And Mrs. Panepinto are encouraged to contact us in the interim if they have any questions or concerns.   Larina Bras, NP-C 02/21/2012, 5:39 PM

## 2012-02-22 ENCOUNTER — Telehealth: Payer: Self-pay | Admitting: Oncology

## 2012-02-22 NOTE — Telephone Encounter (Signed)
s/w pt and he  is aware of his 7/24 appt

## 2012-02-23 ENCOUNTER — Other Ambulatory Visit: Payer: Self-pay | Admitting: *Deleted

## 2012-02-23 ENCOUNTER — Telehealth: Payer: Self-pay | Admitting: Oncology

## 2012-02-23 ENCOUNTER — Telehealth: Payer: Self-pay | Admitting: *Deleted

## 2012-02-23 DIAGNOSIS — C8190 Hodgkin lymphoma, unspecified, unspecified site: Secondary | ICD-10-CM

## 2012-02-23 MED ORDER — LEVOTHYROXINE SODIUM 50 MCG PO TABS: 50.0000 ug | ORAL_TABLET | Freq: Every day | ORAL | Status: DC

## 2012-02-23 NOTE — Telephone Encounter (Signed)
Spoke with pt today and informed pt re: TSH level 7.986 .  Informed pt that md would like for pt to start taking Levothyroxine 50 mcg daily.  Pt to have lab rechecked in 3 months.  Rx called to pt's pharmacy.  Pt understood that a scheduler will contact pt with date and time for lab appt.

## 2012-02-23 NOTE — Telephone Encounter (Signed)
s.w. pt wife and advise on lab only appt for April...Marland Kitchenok

## 2012-05-22 ENCOUNTER — Other Ambulatory Visit: Payer: Self-pay | Admitting: Medical Oncology

## 2012-05-23 ENCOUNTER — Other Ambulatory Visit: Payer: BC Managed Care – PPO

## 2012-05-23 DIAGNOSIS — C8190 Hodgkin lymphoma, unspecified, unspecified site: Secondary | ICD-10-CM

## 2012-05-25 ENCOUNTER — Telehealth: Payer: Self-pay

## 2012-05-25 NOTE — Telephone Encounter (Signed)
lvm to call back with dosage pt is taking of synthroid, compliance? Dr Arline Asp may need to adjust dose for decreased TSH.

## 2012-05-25 NOTE — Telephone Encounter (Signed)
lvm second time to clarify synthroid and compliance.

## 2012-05-28 ENCOUNTER — Telehealth: Payer: Self-pay

## 2012-05-28 ENCOUNTER — Other Ambulatory Visit: Payer: Self-pay

## 2012-05-28 ENCOUNTER — Telehealth: Payer: Self-pay | Admitting: *Deleted

## 2012-05-28 DIAGNOSIS — C8190 Hodgkin lymphoma, unspecified, unspecified site: Secondary | ICD-10-CM

## 2012-05-28 NOTE — Telephone Encounter (Signed)
sw pt gv appt d/t for a lab scheduled for 06/13/12@ 8:45am. Pt is aware...td

## 2012-05-28 NOTE — Telephone Encounter (Signed)
Pt stated he may have missed a few doses synthroid before he came in for lab. He works 3rd shift and on his days off his sleep schedule changes. He then forgets to take his med. I encouraged him to be consistent with a time he takes his med. Will discuss with Dr Arline Asp and call back with dosage information.

## 2012-06-13 ENCOUNTER — Other Ambulatory Visit (HOSPITAL_BASED_OUTPATIENT_CLINIC_OR_DEPARTMENT_OTHER): Payer: BC Managed Care – PPO | Admitting: Lab

## 2012-06-13 DIAGNOSIS — C8190 Hodgkin lymphoma, unspecified, unspecified site: Secondary | ICD-10-CM

## 2012-06-13 DIAGNOSIS — C8198 Hodgkin lymphoma, unspecified, lymph nodes of multiple sites: Secondary | ICD-10-CM

## 2012-06-13 LAB — TSH: TSH: 3.035 u[IU]/mL (ref 0.350–4.500)

## 2012-06-29 NOTE — Progress Notes (Signed)
Dr Arline Asp signed synthroid refill form. He said to advise pt to get another provider for refills, he is retiring. This was done.

## 2012-08-23 ENCOUNTER — Other Ambulatory Visit (HOSPITAL_BASED_OUTPATIENT_CLINIC_OR_DEPARTMENT_OTHER): Payer: BC Managed Care – PPO

## 2012-08-23 ENCOUNTER — Telehealth: Payer: Self-pay | Admitting: Hematology and Oncology

## 2012-08-23 ENCOUNTER — Ambulatory Visit (HOSPITAL_BASED_OUTPATIENT_CLINIC_OR_DEPARTMENT_OTHER): Payer: BC Managed Care – PPO | Admitting: Hematology and Oncology

## 2012-08-23 VITALS — BP 126/90 | HR 69 | Temp 97.0°F | Resp 18 | Ht 75.0 in | Wt 283.0 lb

## 2012-08-23 DIAGNOSIS — C8190 Hodgkin lymphoma, unspecified, unspecified site: Secondary | ICD-10-CM

## 2012-08-23 LAB — COMPREHENSIVE METABOLIC PANEL (CC13)
ALT: 78 U/L — ABNORMAL HIGH (ref 0–55)
Alkaline Phosphatase: 48 U/L (ref 40–150)
Creatinine: 0.9 mg/dL (ref 0.7–1.3)
Sodium: 142 mEq/L (ref 136–145)
Total Bilirubin: 1.19 mg/dL (ref 0.20–1.20)
Total Protein: 7.6 g/dL (ref 6.4–8.3)

## 2012-08-23 LAB — CBC WITH DIFFERENTIAL/PLATELET
BASO%: 0.6 % (ref 0.0–2.0)
Basophils Absolute: 0 10*3/uL (ref 0.0–0.1)
EOS%: 2.2 % (ref 0.0–7.0)
HCT: 43.8 % (ref 38.4–49.9)
HGB: 14.9 g/dL (ref 13.0–17.1)
LYMPH%: 22.3 % (ref 14.0–49.0)
MCH: 29.5 pg (ref 27.2–33.4)
MCHC: 34 g/dL (ref 32.0–36.0)
MCV: 86.9 fL (ref 79.3–98.0)
MONO%: 9.3 % (ref 0.0–14.0)
NEUT%: 65.6 % (ref 39.0–75.0)
Platelets: 178 10*3/uL (ref 140–400)
lymph#: 1.4 10*3/uL (ref 0.9–3.3)

## 2012-08-23 LAB — LACTATE DEHYDROGENASE (CC13): LDH: 209 U/L (ref 125–245)

## 2012-08-23 NOTE — Progress Notes (Signed)
Patient ID: Garrett Bender, male   DOB: 01-Jun-1975, 37 y.o.   MRN: [id number] CSN: [id number]  CC: Lonia Blood, MD  Radene Gunning, MD, Ph.D.  [name], MD  Problem List: Darvis Croft is a 37 y.o. Caucasian male with a problem list consisting of:  1. Hodgkin's lymphoma stage II with a very large mediastinal mass, cervical and supraclavicular lymphadenopathy involving the left side of the neck diagnosed by a biopsy of a left supraclavicular lymph node on 05/17/2007. The patient's symptoms started in October of 2008 but he did not seek medical attention because he did not have medical insurance. The patient had a large left pleural effusion. He received 6-1/2 cycles of ABVD chemotherapy with an excellent response. Chemotherapy was given from 05/25/2007 through 11/09/2007. He then received radiation treatments 3960 centigray from 11/26/2007 through 12/25/2007. He has remained in complete clinical remission since then. Last CT of the abdomen/pelvis was on 02/16/2011. Most recent CT scans of chest with oral and IV contrast was carried out on 02/13/2012.  2. Recurrent staphylococcal folliculitis 3. Obesity 4. History of vertigo from May 2013 5. Pneumovax and influenza vaccinations on 10/21/2011  We saw Mr. Rawlins Stuard today for follow up of his Hodgkin's lymphoma stage II with a very large mediastinal mass, as well as cervical and supraclavicular lymphadenopathy.  His diagnosis dates back to April 2009. 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 He frequently works the night shift.   Since his last office visit there is no acute events. In general, he feels well. He has had no further episodes of vertigo.  The results of the CT scan of the chest on 02/13/2012 was again discussed with the patient.  Past Medical History: Past  Medical History  Diagnosis Date  . Hodgkin's lymphoma     2009    Surgical History: No past surgical history on file.  Current Medications: Current Outpatient Prescriptions  Medication Sig Dispense Refill  . levothyroxine (SYNTHROID) 50 MCG tablet Take 1 tablet (50 mcg total) by mouth daily.  90 tablet  1  . mupirocin ointment (BACTROBAN) 2 % Apply 1 application topically 3 (three) times daily. To backs of fingers      . triamcinolone (KENALOG) 0.025 % cream Apply topically 2 (two) times daily. Bid to tid for 14 days to backs of fingers       No current facility-administered medications for this visit.    Allergies: No Known Allergies  Family History: Family History  Problem Relation Age of Onset  . Diabetes Father   . Heart attack Father 38  . Diabetes Maternal Grandfather     Social History: History  Substance Use Topics  . Smoking status: Never Smoker   . Smokeless tobacco: Never Used  . Alcohol Use: No    Review of Systems: 10 Point review of systems was completed and is negative except as noted above.   Physical Exam:   Blood pressure 126/90, pulse 69, temperature 97 F (36.1 C), temperature source Oral, resp. rate 18, height 6\' 3"  (1.905 m), weight 283 lb (128.368 kg).  O2 sats 97% on RA.  General appearance: Alert, cooperative, well nourished, no apparent distress Head: Normocephalic, without obvious abnormality, atraumatic Eyes: Conjunctivae/corneas clear, PERRLA, EOMI Nose: Nares, septum and mucosa are normal, no drainage or sinus tenderness. Neck: Left supraclavicular fullness, supple, symmetrical,  trachea midline, thyroid not enlarged, no tenderness Resp: Clear to auscultation bilaterally Cardio: Regular rate and rhythm, S1, S2 normal, no murmur, click, rub or gallop GI: Soft, distended, non-tender, hypoactive bowel sounds, no organomegaly Extremities: Extremities normal, atraumatic, no cyanosis or edema, no current staph infection Lymph nodes:  Cervical and axillary nodes normal, left supraclavicular fullness Neurologic: Grossly normal    Laboratory Data: Results for orders placed in visit on 08/23/12 (from the past 48 hour(s))  CBC WITH DIFFERENTIAL     Status: None   Collection Time    08/23/12 10:39 AM      Result Value Range   WBC 6.2  4.0 - 10.3 10e3/uL   NEUT# 4.1  1.5 - 6.5 10e3/uL   HGB 14.9  13.0 - 17.1 g/dL   HCT 16.1  09.6 - 04.5 %   Platelets 178  140 - 400 10e3/uL   MCV 86.9  79.3 - 98.0 fL   MCH 29.5  27.2 - 33.4 pg   MCHC 34.0  32.0 - 36.0 g/dL   RBC 4.09  8.11 - 9.14 10e6/uL   RDW 13.5  11.0 - 14.6 %   lymph# 1.4  0.9 - 3.3 10e3/uL   MONO# 0.6  0.1 - 0.9 10e3/uL   Eosinophils Absolute 0.1  0.0 - 0.5 10e3/uL   Basophils Absolute 0.0  0.0 - 0.1 10e3/uL   NEUT% 65.6  39.0 - 75.0 %   LYMPH% 22.3  14.0 - 49.0 %   MONO% 9.3  0.0 - 14.0 %   EOS% 2.2  0.0 - 7.0 %   BASO% 0.6  0.0 - 2.0 %     Imaging Studies: 1. CT scan of the chest with IV contrast on 05/13/2009 showed an interval decrease in the size of the anterior mediastinal mass, interval resolution of radiation changes in the lungs and a small 3 mm pulmonary nodule right lower lobe.  2. PET scan on 01/27/2010 showed no evidence for hypermetabolic disease to suggest metabolically active lymphoma. The soft tissue  density in the anterior mediastinum has decreased in size in the interval measuring 2.9 x 5.0 cm as compared with 3.3 x 5.7 cm from  the scan of 09/12/2008.  3. CT scan of chest, abdomen and pelvis with oral and IV contrast on 02/16/2011 showed the anterior mediastinal soft tissue density  which was partially calcified measuring 2.9 x 5.2 cm on image 17. This measured 2.9 x 5.0 cm on a scan carried out on 01/27/2010.  The rest of the scans are negative. 4.  CT scan of the chest with contrast on 02/13/2012 showed  the anterior mediastinal mass measures slightly smaller today when measuring at the same level and in the same dimensions and which  was measured previously.  The lesion measures 2.3 x 4.5 cm today compared 2.9 x 5.2 cm previously.  There is some central dystrophic calcification within the lesion.  No other mediastinal or hilar lymphadenopathy. Heart size is normal.  There is no pericardial or pleural effusion.  Bone windows show no parenchymal nodular mass.  There is no focal airspace consolidation.  Subtle ground-glass attenuation in the posterior left upper lobe may be related to an infectious or inflammatory alveolitis.  Bone windows reveal no worrisome lytic or sclerotic osseous lesions.  IMPRESSION: Interval decrease in the anterior mediastinal mass with central dystrophic calcification.  Imaging features are compatible with treated lymphoma.  Subtle small area of ground-glass attenuation in the posterior left upper lobe may be related to  infectious or inflammatory alveolitis.   Impression/Plan: Mr. Gianni Mihalik is now out almost 5 years from the time of diagnosis without evidence of disease recurrence. He has no signs of relapse and he continues to do well except for the fatigue. We will plan to see Mr. Wach again in 3 months, at which time we will check CBC, chemistries, LDH. The patient was asked to find a primary care physician.   Mr.  Slone are encouraged to contact us in the interim if they have any questions or concerns.   Arlis Yale E,  08/23/2012, 10:58 AM

## 2012-08-23 NOTE — Telephone Encounter (Signed)
gv and printed appt sched and avs for pt  °

## 2012-11-20 ENCOUNTER — Other Ambulatory Visit: Payer: Self-pay | Admitting: Medical Oncology

## 2012-11-20 DIAGNOSIS — C8190 Hodgkin lymphoma, unspecified, unspecified site: Secondary | ICD-10-CM

## 2012-11-21 ENCOUNTER — Telehealth: Payer: Self-pay | Admitting: Internal Medicine

## 2012-11-21 ENCOUNTER — Ambulatory Visit (HOSPITAL_BASED_OUTPATIENT_CLINIC_OR_DEPARTMENT_OTHER): Payer: BC Managed Care – PPO | Admitting: Internal Medicine

## 2012-11-21 ENCOUNTER — Other Ambulatory Visit (HOSPITAL_BASED_OUTPATIENT_CLINIC_OR_DEPARTMENT_OTHER): Payer: BC Managed Care – PPO | Admitting: Lab

## 2012-11-21 VITALS — BP 125/88 | HR 79 | Temp 97.5°F | Resp 20 | Ht 75.0 in | Wt 286.7 lb

## 2012-11-21 DIAGNOSIS — E669 Obesity, unspecified: Secondary | ICD-10-CM

## 2012-11-21 DIAGNOSIS — C8198 Hodgkin lymphoma, unspecified, lymph nodes of multiple sites: Secondary | ICD-10-CM

## 2012-11-21 DIAGNOSIS — E039 Hypothyroidism, unspecified: Secondary | ICD-10-CM

## 2012-11-21 DIAGNOSIS — C8190 Hodgkin lymphoma, unspecified, unspecified site: Secondary | ICD-10-CM

## 2012-11-21 LAB — COMPREHENSIVE METABOLIC PANEL (CC13)
Albumin: 3.9 g/dL (ref 3.5–5.0)
Alkaline Phosphatase: 52 U/L (ref 40–150)
Anion Gap: 11 mEq/L (ref 3–11)
BUN: 17.5 mg/dL (ref 7.0–26.0)
CO2: 22 mEq/L (ref 22–29)
Chloride: 107 mEq/L (ref 98–109)
Glucose: 129 mg/dl (ref 70–140)
Potassium: 3.8 mEq/L (ref 3.5–5.1)
Sodium: 139 mEq/L (ref 136–145)
Total Bilirubin: 0.93 mg/dL (ref 0.20–1.20)

## 2012-11-21 LAB — CBC WITH DIFFERENTIAL/PLATELET
Eosinophils Absolute: 0.2 10*3/uL (ref 0.0–0.5)
HCT: 43.5 % (ref 38.4–49.9)
LYMPH%: 21.5 % (ref 14.0–49.0)
MCHC: 33.7 g/dL (ref 32.0–36.0)
MCV: 85.9 fL (ref 79.3–98.0)
MONO#: 0.5 10*3/uL (ref 0.1–0.9)
MONO%: 7.4 % (ref 0.0–14.0)
NEUT#: 4.7 10*3/uL (ref 1.5–6.5)
NEUT%: 67.5 % (ref 39.0–75.0)
Platelets: 196 10*3/uL (ref 140–400)
RBC: 5.07 10*6/uL (ref 4.20–5.82)

## 2012-11-21 LAB — LACTATE DEHYDROGENASE (CC13): LDH: 237 U/L (ref 125–245)

## 2012-11-21 NOTE — Telephone Encounter (Signed)
Gave pt appt for lab and Md for April 2015 °

## 2012-11-22 DIAGNOSIS — E669 Obesity, unspecified: Secondary | ICD-10-CM | POA: Insufficient documentation

## 2012-11-22 NOTE — Progress Notes (Signed)
[facility] OFFICE PROGRESS NOTE  No PCP Per Patient [address] [place] [address]  DIAGNOSIS: Hodgkin's lymphoma - Plan: CBC with Differential, Comprehensive metabolic panel  Hypothyroid - Plan: TSH  Obesity  Chief Complaint  Patient presents with  . Lymphoma    CURRENT THERAPY: Observation.   INTERVAL HISTORY: Garrett Bender 37 y.o. male with a history of Hodgkin's lymphoma stage II with a very large mediastinal mass  as well as cervical and supraclavicular lymphadenopathy s/p ABVD and XRT (as detailed below) in 2009 is here for follow-up.  He was last seen by Dr. Karel Jarvis on 08/23/12.  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  He also denies night sweats, fevers or chills.   He denies acute shortness of breath.  He still is trying to establish care with a primary physician.    MEDICAL HISTORY: Past Medical History  Diagnosis Date  . Hodgkin's lymphoma     2009    INTERIM HISTORY: has Hodgkin's lymphoma; Hypothyroid; and Obesity on his problem list.    ALLERGIES:  has No Known Allergies.  MEDICATIONS: has a current medication list which includes the following prescription(s): levothyroxine, mupirocin ointment, and triamcinolone.  SURGICAL HISTORY: No past surgical history on file.  ONCOLOGY HISTORY: 1. Hodgkin's lymphoma stage II with a very large mediastinal mass, cervical and supraclavicular lymphadenopathy involving the left side of the neck diagnosed by a biopsy of a left supraclavicular lymph node on 05/17/2007. The patient's symptoms started in October of 2008 but he did not seek medical attention because he did not have medical insurance. The patient had a large left pleural effusion. He received 6-1/2 cycles of ABVD chemotherapy with an excellent response. Chemotherapy  was given from 05/25/2007 through 11/09/2007. He then received radiation treatments 3960 centigray from 11/26/2007 through 12/25/2007. He has remained in complete clinical remission since then. Last CT of the abdomen/pelvis was on 02/16/2011. Most recent CT scans of chest with oral and IV contrast was carried out on 02/13/2012.  2. Recurrent staphylococcal folliculitis  3. Obesity  4. History of vertigo from May 2013  5. Pneumovax and influenza vaccinations on 10/21/2011  REVIEW OF SYSTEMS:   Constitutional: Denies fevers, chills or abnormal weight loss Eyes: Denies blurriness of vision Ears, nose, mouth, throat, and face: Denies mucositis or sore throat Respiratory: Denies cough, dyspnea or wheezes Cardiovascular: Denies palpitation, chest discomfort or lower extremity swelling Gastrointestinal:  Denies nausea, heartburn or change in bowel habits Skin: Denies abnormal skin rashes Lymphatics: Denies new lymphadenopathy or easy bruising Neurological:Denies numbness, tingling or new weaknesses Behavioral/Psych: Mood is stable, no new changes  All other systems were reviewed with the patient and are negative.  PHYSICAL EXAMINATION: ECOG PERFORMANCE STATUS: 0 - Asymptomatic  Blood pressure 125/88, pulse 79, temperature 97.5 F (36.4 C), temperature source Oral, resp. rate 20, height 6\' 3"  (1.905 m), weight 286 lb 11.2 oz (130.046 kg).  GENERAL:alert, no distress and comfortable; moderately obese SKIN: skin color, texture, turgor are normal, no rashes or significant lesions EYES: normal, Conjunctiva are pink and non-injected, sclera clear OROPHARYNX:no exudate, no erythema and lips, buccal mucosa, and tongue normal  NECK: supple, thyroid normal size, non-tender, without nodularity LYMPH:  no palpable lymphadenopathy in the cervical, axillary or supraclavicular LUNGS: clear to auscultation and percussion with normal breathing effort HEART:  regular rate & rhythm and no murmurs and no lower  extremity edema ABDOMEN:abdomen soft, non-tender and normal bowel sounds Musculoskeletal:no cyanosis of digits and no clubbing  NEURO: alert & oriented x 3 with fluent speech, no focal motor/sensory deficits  LABORATORY DATA: Results for orders placed in visit on 11/21/12 (from the past 48 hour(s))  CBC WITH DIFFERENTIAL     Status: None   Collection Time    11/21/12 10:45 AM      Result Value Range   WBC 7.0  4.0 - 10.3 10e3/uL   NEUT# 4.7  1.5 - 6.5 10e3/uL   HGB 14.7  13.0 - 17.1 g/dL   HCT 16.1  09.6 - 04.5 %   Platelets 196  140 - 400 10e3/uL   MCV 85.9  79.3 - 98.0 fL   MCH 28.9  27.2 - 33.4 pg   MCHC 33.7  32.0 - 36.0 g/dL   RBC 4.09  8.11 - 9.14 10e6/uL   RDW 13.3  11.0 - 14.6 %   lymph# 1.5  0.9 - 3.3 10e3/uL   MONO# 0.5  0.1 - 0.9 10e3/uL   Eosinophils Absolute 0.2  0.0 - 0.5 10e3/uL   Basophils Absolute 0.1  0.0 - 0.1 10e3/uL   NEUT% 67.5  39.0 - 75.0 %   LYMPH% 21.5  14.0 - 49.0 %   MONO% 7.4  0.0 - 14.0 %   EOS% 2.8  0.0 - 7.0 %   BASO% 0.8  0.0 - 2.0 %  COMPREHENSIVE METABOLIC PANEL (CC13)     Status: None   Collection Time    11/21/12 10:45 AM      Result Value Range   Sodium 139  136 - 145 mEq/L   Potassium 3.8  3.5 - 5.1 mEq/L   Chloride 107  98 - 109 mEq/L   CO2 22  22 - 29 mEq/L   Glucose 129  70 - 140 mg/dl   BUN 78.2  7.0 - 95.6 mg/dL   Creatinine 0.9  0.7 - 1.3 mg/dL   Total Bilirubin 2.13  0.20 - 1.20 mg/dL   Alkaline Phosphatase 52  40 - 150 U/L   AST 29  5 - 34 U/L   ALT 54  0 - 55 U/L   Total Protein 7.7  6.4 - 8.3 g/dL   Albumin 3.9  3.5 - 5.0 g/dL   Calcium 9.5  8.4 - 08.6 mg/dL   Anion Gap 11  3 - 11 mEq/L  LACTATE DEHYDROGENASE (CC13)     Status: None   Collection Time    11/21/12 10:45 AM      Result Value Range   LDH 237  125 - 245 U/L    Labs:  Lab Results  Component Value Date   WBC 7.0 11/21/2012   HGB 14.7 11/21/2012   HCT 43.5 11/21/2012   MCV 85.9 11/21/2012   PLT 196 11/21/2012   NEUTROABS 4.7 11/21/2012       Chemistry      Component Value Date/Time   NA 139 11/21/2012 1045   NA 140 06/17/2011 1313   K 3.8 11/21/2012 1045   K 4.0 06/17/2011 1313   CL 106 02/21/2012 1522   CL 107 06/17/2011 1313   CO2 22 11/21/2012 1045   CO2 25 06/17/2011 1313   BUN 17.5 11/21/2012 1045   BUN 18 06/17/2011 1313   CREATININE 0.9 11/21/2012 1045   CREATININE 0.94 06/17/2011 1313      Component Value Date/Time  CALCIUM 9.5 11/21/2012 1045   CALCIUM 9.0 06/17/2011 1313   ALKPHOS 52 11/21/2012 1045   ALKPHOS 45 06/17/2011 1313   AST 29 11/21/2012 1045   AST 19 06/17/2011 1313   ALT 54 11/21/2012 1045   ALT 21 06/17/2011 1313   BILITOT 0.93 11/21/2012 1045   BILITOT 0.9 06/17/2011 1313     Basic Metabolic Panel:  Recent Labs Lab 11/21/12 1045  NA 139  K 3.8  CO2 22  GLUCOSE 129  BUN 17.5  CREATININE 0.9  CALCIUM 9.5   GFR Estimated Creatinine Clearance: 168 ml/min (by C-G formula based on Cr of 0.9). Liver Function Tests:  Recent Labs Lab 11/21/12 1045  AST 29  ALT 54  ALKPHOS 52  BILITOT 0.93  PROT 7.7  ALBUMIN 3.9   CBC:  Recent Labs Lab 11/21/12 1045  WBC 7.0  NEUTROABS 4.7  HGB 14.7  HCT 43.5  MCV 85.9  PLT 196    Studies:  No results found.   RADIOGRAPHIC STUDIES: No results found.  ASSESSMENT: Garrett Bender 37 y.o. male with a history of Hodgkin's lymphoma - Plan: CBC with Differential, Comprehensive metabolic panel  Hypothyroid - Plan: TSH  Obesity   PLAN: 1. Hodgkin's lymphoma. --Mr. Eicher is out almost 5 years from the time of diagnosis without evidence of disease recurrence. He has no signs of relapse and he continues to do well except for the fatigue and weight gain.  His labs were reviewed and are within normal limits.   2. Hypothyroidism. -- We counseled him to continue his synthroid.  We will repeat his TSH on his next visit.   3. Obesity.  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  He deferred  nutrition recommendations.  He will continue moderate exercise and dieting.  We discussed that obesity has been associated with chronic illnesses including diabetes and hypertension.    4. Follow-up.  --We will plan to see Mr. Matuszak again in 3 months, at which time we will check CBC, chemistries, LDH. The patient was asked to find a primary care physician.   All questions were answered. The patient knows to call the clinic with any problems, questions or concerns. We can certainly see the patient much sooner if necessary.  I spent 10 minutes counseling the patient face to face. The total time spent in the appointment was 15 minutes.    Benigno Check, MD 11/22/2012 11:46 AM

## 2013-05-22 ENCOUNTER — Other Ambulatory Visit (HOSPITAL_BASED_OUTPATIENT_CLINIC_OR_DEPARTMENT_OTHER): Payer: BC Managed Care – PPO

## 2013-05-22 ENCOUNTER — Telehealth: Payer: Self-pay | Admitting: Internal Medicine

## 2013-05-22 ENCOUNTER — Ambulatory Visit (HOSPITAL_BASED_OUTPATIENT_CLINIC_OR_DEPARTMENT_OTHER): Payer: BC Managed Care – PPO | Admitting: Internal Medicine

## 2013-05-22 VITALS — BP 120/75 | HR 85 | Temp 97.9°F | Resp 18 | Ht 75.0 in | Wt 287.2 lb

## 2013-05-22 DIAGNOSIS — E669 Obesity, unspecified: Secondary | ICD-10-CM

## 2013-05-22 DIAGNOSIS — E039 Hypothyroidism, unspecified: Secondary | ICD-10-CM

## 2013-05-22 DIAGNOSIS — C8190 Hodgkin lymphoma, unspecified, unspecified site: Secondary | ICD-10-CM

## 2013-05-22 LAB — CBC WITH DIFFERENTIAL/PLATELET
BASO%: 0.9 % (ref 0.0–2.0)
BASOS ABS: 0.1 10*3/uL (ref 0.0–0.1)
EOS ABS: 0.2 10*3/uL (ref 0.0–0.5)
EOS%: 3 % (ref 0.0–7.0)
HCT: 45.6 % (ref 38.4–49.9)
HEMOGLOBIN: 15.2 g/dL (ref 13.0–17.1)
LYMPH%: 20.1 % (ref 14.0–49.0)
MCH: 29 pg (ref 27.2–33.4)
MCHC: 33.3 g/dL (ref 32.0–36.0)
MCV: 87.1 fL (ref 79.3–98.0)
MONO#: 0.5 10*3/uL (ref 0.1–0.9)
MONO%: 7.2 % (ref 0.0–14.0)
NEUT#: 4.5 10*3/uL (ref 1.5–6.5)
NEUT%: 68.8 % (ref 39.0–75.0)
PLATELETS: 185 10*3/uL (ref 140–400)
RBC: 5.23 10*6/uL (ref 4.20–5.82)
RDW: 13.3 % (ref 11.0–14.6)
WBC: 6.6 10*3/uL (ref 4.0–10.3)
lymph#: 1.3 10*3/uL (ref 0.9–3.3)

## 2013-05-22 LAB — COMPREHENSIVE METABOLIC PANEL (CC13)
ALT: 61 U/L — ABNORMAL HIGH (ref 0–55)
AST: 29 U/L (ref 5–34)
Albumin: 4.2 g/dL (ref 3.5–5.0)
Alkaline Phosphatase: 48 U/L (ref 40–150)
Anion Gap: 11 mEq/L (ref 3–11)
BILIRUBIN TOTAL: 1.07 mg/dL (ref 0.20–1.20)
BUN: 21.7 mg/dL (ref 7.0–26.0)
CO2: 22 mEq/L (ref 22–29)
CREATININE: 1 mg/dL (ref 0.7–1.3)
Calcium: 9.8 mg/dL (ref 8.4–10.4)
Chloride: 109 mEq/L (ref 98–109)
GLUCOSE: 104 mg/dL (ref 70–140)
Potassium: 3.9 mEq/L (ref 3.5–5.1)
Sodium: 141 mEq/L (ref 136–145)
Total Protein: 7.6 g/dL (ref 6.4–8.3)

## 2013-05-22 LAB — TSH CHCC: TSH: 4.355 m[IU]/L — AB (ref 0.320–4.118)

## 2013-05-22 NOTE — Telephone Encounter (Signed)
gva dn pritned appt sched and avs for pt for OCT °

## 2013-05-22 NOTE — Progress Notes (Signed)
[place] OFFICE PROGRESS NOTE  No PCP Per Patient No address on file  DIAGNOSIS: Hodgkin's lymphoma  Chief Complaint  Patient presents with  . Follow-up    CURRENT THERAPY: Observation.   INTERVAL HISTORY: Garrett Bender 38 y.o. male with a history of Hodgkin's lymphoma stage II with a very large mediastinal mass  as well as cervical and supraclavicular lymphadenopathy s/p ABVD and XRT (as detailed below) in 2009 is here for follow-up.  He was last seen by me on 11/21/2012.  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 He reports that he is still gaining weight.    He also denies night sweats, fevers or chills.   He denies acute shortness of breath.  He still is trying to establish care with a primary physician.    MEDICAL HISTORY: Past Medical History  Diagnosis Date  . Hodgkin's lymphoma     2009    INTERIM HISTORY: has Hodgkin's lymphoma; Hypothyroid; and Obesity on his problem list.    ALLERGIES:  has No Known Allergies.  MEDICATIONS: has a current medication list which includes the following prescription(s): mupirocin ointment, triamcinolone, and levothyroxine.  SURGICAL HISTORY: No past surgical history on file.  ONCOLOGY HISTORY: 1. Hodgkin's lymphoma stage II with a very large mediastinal mass, cervical and supraclavicular lymphadenopathy involving the left side of the neck diagnosed by a biopsy of a left supraclavicular lymph node on 05/17/2007. The patient's symptoms started in October of 2008 but he did not seek medical attention because he did not have medical insurance. The patient had a large left pleural effusion. He received 6-1/2 cycles of ABVD chemotherapy with an excellent response. Chemotherapy was given from 05/25/2007 through 11/09/2007. He then received radiation  treatments 3960 centigray from 11/26/2007 through 12/25/2007. He has remained in complete clinical remission since then. Last CT of the abdomen/pelvis was on 02/16/2011. Most recent CT scans of chest with oral and IV contrast was carried out on 02/13/2012.  2. Recurrent staphylococcal folliculitis  3. Obesity  4. History of vertigo from May 2013  5. Pneumovax and influenza vaccinations on 10/21/2011  REVIEW OF SYSTEMS:   Constitutional: Denies fevers, chills or abnormal weight loss Eyes: Denies blurriness of vision Ears, nose, mouth, throat, and face: Denies mucositis or sore throat Respiratory: Denies cough, dyspnea or wheezes Cardiovascular: Denies palpitation, chest discomfort or lower extremity swelling Gastrointestinal:  Denies nausea, heartburn or change in bowel habits Skin: Denies abnormal skin rashes Lymphatics: Denies new lymphadenopathy or easy bruising Neurological:Denies numbness, tingling or new weaknesses Behavioral/Psych: Mood is stable, no new changes  All other systems were reviewed with the patient and are negative.  PHYSICAL EXAMINATION: ECOG PERFORMANCE STATUS: 0 - Asymptomatic  Blood pressure 120/75, pulse 85, temperature 97.9 F (36.6 C), temperature source Oral, resp. rate 18, height 6\' 3"  (1.905 m), weight 287 lb 3.2 oz (130.273 kg).  GENERAL:alert, no distress and comfortable; moderately obese SKIN: skin color, texture, turgor are normal, no rashes or significant lesions EYES: normal, Conjunctiva are pink and non-injected, sclera clear OROPHARYNX:no exudate, no erythema and lips, buccal mucosa, and tongue normal  NECK: supple, thyroid normal size, non-tender, without nodularity LYMPH:  no palpable lymphadenopathy in the cervical, axillary or supraclavicular LUNGS: clear to auscultation and percussion with normal breathing effort HEART: regular rate & rhythm and no murmurs and no  lower extremity edema ABDOMEN:abdomen soft, non-tender and normal bowel  sounds Musculoskeletal:no cyanosis of digits and no clubbing  NEURO: alert & oriented x 3 with fluent speech, no focal motor/sensory deficits  LABORATORY DATA: Results for orders placed in visit on 05/22/13 (from the past 48 hour(s))  CBC WITH DIFFERENTIAL     Status: None   Collection Time    05/22/13  9:45 AM      Result Value Ref Range   WBC 6.6  4.0 - 10.3 10e3/uL   NEUT# 4.5  1.5 - 6.5 10e3/uL   HGB 15.2  13.0 - 17.1 g/dL   HCT 45.6  38.4 - 49.9 %   Platelets 185  140 - 400 10e3/uL   MCV 87.1  79.3 - 98.0 fL   MCH 29.0  27.2 - 33.4 pg   MCHC 33.3  32.0 - 36.0 g/dL   RBC 5.23  4.20 - 5.82 10e6/uL   RDW 13.3  11.0 - 14.6 %   lymph# 1.3  0.9 - 3.3 10e3/uL   MONO# 0.5  0.1 - 0.9 10e3/uL   Eosinophils Absolute 0.2  0.0 - 0.5 10e3/uL   Basophils Absolute 0.1  0.0 - 0.1 10e3/uL   NEUT% 68.8  39.0 - 75.0 %   LYMPH% 20.1  14.0 - 49.0 %   MONO% 7.2  0.0 - 14.0 %   EOS% 3.0  0.0 - 7.0 %   BASO% 0.9  0.0 - 2.0 %  COMPREHENSIVE METABOLIC PANEL (ZO10)     Status: Abnormal   Collection Time    05/22/13  9:46 AM      Result Value Ref Range   Sodium 141  136 - 145 mEq/L   Potassium 3.9  3.5 - 5.1 mEq/L   Chloride 109  98 - 109 mEq/L   CO2 22  22 - 29 mEq/L   Glucose 104  70 - 140 mg/dl   BUN 21.7  7.0 - 26.0 mg/dL   Creatinine 1.0  0.7 - 1.3 mg/dL   Total Bilirubin 1.07  0.20 - 1.20 mg/dL   Alkaline Phosphatase 48  40 - 150 U/L   AST 29  5 - 34 U/L   ALT 61 (*) 0 - 55 U/L   Total Protein 7.6  6.4 - 8.3 g/dL   Albumin 4.2  3.5 - 5.0 g/dL   Calcium 9.8  8.4 - 10.4 mg/dL   Anion Gap 11  3 - 11 mEq/L    Labs:  Lab Results  Component Value Date   WBC 6.6 05/22/2013   HGB 15.2 05/22/2013   HCT 45.6 05/22/2013   MCV 87.1 05/22/2013   PLT 185 05/22/2013   NEUTROABS 4.5 05/22/2013      Chemistry      Component Value Date/Time   NA 141 05/22/2013 0946   NA 140 06/17/2011 1313   K 3.9 05/22/2013 0946   K 4.0 06/17/2011 1313   CL 106 02/21/2012 1522   CL 107 06/17/2011 1313   CO2 22  05/22/2013 0946   CO2 25 06/17/2011 1313   BUN 21.7 05/22/2013 0946   BUN 18 06/17/2011 1313   CREATININE 1.0 05/22/2013 0946   CREATININE 0.94 06/17/2011 1313      Component Value Date/Time   CALCIUM 9.8 05/22/2013 0946   CALCIUM 9.0 06/17/2011 1313   ALKPHOS 48 05/22/2013 0946   ALKPHOS 45 06/17/2011 1313   AST 29 05/22/2013 0946   AST 19 06/17/2011 1313   ALT 61* 05/22/2013 9604  ALT 21 06/17/2011 1313   BILITOT 1.07 05/22/2013 0946   BILITOT 0.9 06/17/2011 1313     Basic Metabolic Panel:  Recent Labs Lab 05/22/13 0946  NA 141  K 3.9  CO2 22  GLUCOSE 104  BUN 21.7  CREATININE 1.0  CALCIUM 9.8   GFR Estimated Creatinine Clearance: 149.9 ml/min (by C-G formula based on Cr of 1). Liver Function Tests:  Recent Labs Lab 05/22/13 0946  AST 29  ALT 61*  ALKPHOS 48  BILITOT 1.07  PROT 7.6  ALBUMIN 4.2   CBC:  Recent Labs Lab 05/22/13 0945  WBC 6.6  NEUTROABS 4.5  HGB 15.2  HCT 45.6  MCV 87.1  PLT 185   Results for MONTAVIOUS, WIERZBA (MRN [id number]) as of 05/22/2013 11:15  Ref. Range 05/22/2013 09:45  TSH Latest Range: 0.320-4.118 m(IU)/L 4.355 (H)   Studies:  No results found.   RADIOGRAPHIC STUDIES: No results found.  ASSESSMENT: Garrett Bender 38 y.o. male with a history of Hodgkin's lymphoma   PLAN: 1. Hodgkin's lymphoma. --Mr. Backlund is out almost 6 years from the time of diagnosis without evidence of disease recurrence. He has no signs of relapse and he continues to do well except for the fatigue and weight gain.  His labs were reviewed and are within normal limits with the exception of elevated ALT.    2. Hypothyroidism. -- We counseled him to continue his synthroid.  His TSH is 4.355.   3. Obesity.  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  He deferred nutrition recommendations.  He will continue moderate exercise and dieting.  We discussed that obesity has been associated with chronic illnesses  including diabetes and hypertension.    4. Follow-up.  --We will plan to see Mr. Wrightsman again in 6 months, at which time we will check CBC, chemistries, LDH. The patient was asked to find a primary care physician.   All questions were answered. The patient knows to call the clinic with any problems, questions or concerns. We can certainly see the patient much sooner if necessary.  I spent 10 minutes counseling the patient face to face. The total time spent in the appointment was 15 minutes.    Concha Norway, MD 05/22/2013 10:54 AM

## 2013-11-20 ENCOUNTER — Other Ambulatory Visit: Payer: Self-pay | Admitting: *Deleted

## 2013-11-20 DIAGNOSIS — C8190 Hodgkin lymphoma, unspecified, unspecified site: Secondary | ICD-10-CM

## 2013-11-21 ENCOUNTER — Ambulatory Visit (HOSPITAL_BASED_OUTPATIENT_CLINIC_OR_DEPARTMENT_OTHER): Payer: BC Managed Care – PPO | Admitting: Hematology

## 2013-11-21 ENCOUNTER — Telehealth: Payer: Self-pay | Admitting: Hematology

## 2013-11-21 ENCOUNTER — Ambulatory Visit (HOSPITAL_BASED_OUTPATIENT_CLINIC_OR_DEPARTMENT_OTHER): Payer: BC Managed Care – PPO

## 2013-11-21 ENCOUNTER — Other Ambulatory Visit (HOSPITAL_BASED_OUTPATIENT_CLINIC_OR_DEPARTMENT_OTHER): Payer: BC Managed Care – PPO

## 2013-11-21 ENCOUNTER — Encounter: Payer: Self-pay | Admitting: Hematology

## 2013-11-21 VITALS — BP 114/76 | HR 79 | Temp 98.1°F | Resp 19 | Ht 75.0 in | Wt 293.4 lb

## 2013-11-21 DIAGNOSIS — C8190 Hodgkin lymphoma, unspecified, unspecified site: Secondary | ICD-10-CM

## 2013-11-21 DIAGNOSIS — Z8572 Personal history of non-Hodgkin lymphomas: Secondary | ICD-10-CM

## 2013-11-21 DIAGNOSIS — E039 Hypothyroidism, unspecified: Secondary | ICD-10-CM

## 2013-11-21 DIAGNOSIS — E669 Obesity, unspecified: Secondary | ICD-10-CM

## 2013-11-21 LAB — CBC WITH DIFFERENTIAL/PLATELET
BASO%: 0.8 % (ref 0.0–2.0)
Basophils Absolute: 0.1 10*3/uL (ref 0.0–0.1)
EOS ABS: 0.2 10*3/uL (ref 0.0–0.5)
EOS%: 3.4 % (ref 0.0–7.0)
HCT: 42.2 % (ref 38.4–49.9)
HGB: 14.1 g/dL (ref 13.0–17.1)
LYMPH%: 24.1 % (ref 14.0–49.0)
MCH: 29.4 pg (ref 27.2–33.4)
MCHC: 33.4 g/dL (ref 32.0–36.0)
MCV: 88.1 fL (ref 79.3–98.0)
MONO#: 0.5 10*3/uL (ref 0.1–0.9)
MONO%: 8.6 % (ref 0.0–14.0)
NEUT%: 63.1 % (ref 39.0–75.0)
NEUTROS ABS: 3.7 10*3/uL (ref 1.5–6.5)
Platelets: 178 10*3/uL (ref 140–400)
RBC: 4.79 10*6/uL (ref 4.20–5.82)
RDW: 13.2 % (ref 11.0–14.6)
WBC: 5.9 10*3/uL (ref 4.0–10.3)
lymph#: 1.4 10*3/uL (ref 0.9–3.3)

## 2013-11-21 LAB — COMPREHENSIVE METABOLIC PANEL (CC13)
ALK PHOS: 43 U/L (ref 40–150)
ALT: 78 U/L — ABNORMAL HIGH (ref 0–55)
AST: 34 U/L (ref 5–34)
Albumin: 3.9 g/dL (ref 3.5–5.0)
Anion Gap: 8 mEq/L (ref 3–11)
BUN: 13.4 mg/dL (ref 7.0–26.0)
CO2: 24 mEq/L (ref 22–29)
Calcium: 9.4 mg/dL (ref 8.4–10.4)
Chloride: 107 mEq/L (ref 98–109)
Creatinine: 0.9 mg/dL (ref 0.7–1.3)
Glucose: 112 mg/dl (ref 70–140)
POTASSIUM: 3.8 meq/L (ref 3.5–5.1)
SODIUM: 139 meq/L (ref 136–145)
TOTAL PROTEIN: 6.9 g/dL (ref 6.4–8.3)
Total Bilirubin: 1.07 mg/dL (ref 0.20–1.20)

## 2013-11-21 LAB — TSH CHCC: TSH: 6.938 m(IU)/L — ABNORMAL HIGH (ref 0.320–4.118)

## 2013-11-21 MED ORDER — LEVOTHYROXINE SODIUM 50 MCG PO TABS: 50.0000 ug | ORAL_TABLET | Freq: Every day | ORAL | Status: DC

## 2013-11-21 NOTE — Telephone Encounter (Signed)
Pt confirmed labs/ov per 10/22 POF, gave pt AVS....... KJ °

## 2013-11-21 NOTE — Progress Notes (Signed)
[place] [facility] PROGRESS NOTE DATE OF VISIT: 11/21/2013  No PCP Per Patient No address on file  DIAGNOSIS: Hodgkin's lymphoma - Plan: levothyroxine (SYNTHROID) 50 MCG tablet, CBC with Differential, Comprehensive metabolic panel (Cmet) - [facility], Lactate dehydrogenase (LDH) - [facility]  Hypothyroidism, unspecified hypothyroidism type - Plan: TSH-Thyroid Stimulating Hormone, TSH-Thyroid Stimulating Hormone  Chief Complaint  Patient presents with  . Follow-up    CURRENT THERAPY: Observation.   INTERVAL HISTORY:  Garrett Bender 38 y.o. male with a history of Hodgkin's lymphoma stage II with a very large mediastinal mass  as well as cervical and supraclavicular lymphadenopathy (I reviewed initial CAT scans today) s/p ABVD and XRT (as detailed below) in 2009 is here for follow-up.  He was last seen by Dr Juliann Mule on 05/22/2013.  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 He reports that he is still gaining weight.    He also denies night sweats, fevers or chills.   He denies acute shortness of breath.  He still is trying to establish care with a primary physician.  He states he has insurance now and will get a PCP. I emphasized the importance of that.   His weight is up from 287 to 293 lbs and he stopped taking thyroid medication saying his last level was normal. I told him he needs to take it regularly and also that it could be contributing to his weight gain. I am checking a TSH today and again in 6 months. He has one brother age 14 and one sister age 86 (just in case there is a need for allogenic transplant although first line would be an auto transplant if he relapses). He did get a flu shot at his work place today.  MEDICAL HISTORY: Past Medical History  Diagnosis Date  . Hodgkin's lymphoma      2009    INTERIM HISTORY: has Hodgkin's lymphoma; Hypothyroid; and Obesity on his problem list.    ALLERGIES:  has No Known Allergies.  MEDICATIONS: has a current medication list which includes the following prescription(s): mupirocin ointment, triamcinolone, and levothyroxine.  SURGICAL HISTORY: History reviewed. No pertinent past surgical history.  ONCOLOGY HISTORY: 1. Hodgkin's lymphoma stage II with a very large mediastinal mass, cervical and supraclavicular lymphadenopathy involving the left side of the neck diagnosed by a biopsy of a left supraclavicular lymph node on 05/17/2007. The patient's symptoms started in October of 2008 but he did not seek medical attention because he did not have medical insurance. The patient had a large left pleural effusion. He received 6-1/2 cycles of ABVD chemotherapy with an excellent response. Chemotherapy was given from 05/25/2007 through 11/09/2007. He then received radiation treatments 3960 centigray from 11/26/2007 through 12/25/2007. He has remained in complete clinical remission since then. Last CT of the abdomen/pelvis was on 02/16/2011. Most recent CT scans of chest with oral and IV contrast was carried out on 02/13/2012.  2. Recurrent staphylococcal folliculitis  3. Obesity  4. History of vertigo from May 2013  5. Pneumovax on 10/21/2011 and Flu shot today 11/21/13.  REVIEW OF SYSTEMS:   Constitutional: Denies fevers, chills or abnormal weight loss Eyes: Denies blurriness of vision Ears, nose, mouth, throat, and face: Denies mucositis or sore throat Respiratory: Denies cough, dyspnea or wheezes Cardiovascular: Denies palpitation, chest discomfort or lower extremity swelling Gastrointestinal:  Denies nausea, heartburn or change in bowel habits Skin: Denies abnormal skin rashes Lymphatics: Denies new lymphadenopathy or easy bruising Neurological:Denies numbness, tingling or new weaknesses Behavioral/Psych: Mood is stable, no new changes  All  other systems were reviewed with the patient and are negative.  PHYSICAL EXAMINATION: ECOG PERFORMANCE STATUS: 0  Blood pressure 114/76, pulse 79, temperature 98.1 F (36.7 C), temperature source Oral, resp. rate 19, height 6\' 3"  (1.905 m), weight 293 lb 6.4 oz (133.085 kg).  GENERAL:alert, no distress and comfortable; moderately obese SKIN: skin color, texture, turgor are normal, no rashes or significant lesions EYES: normal, Conjunctiva are pink and non-injected, sclera clear OROPHARYNX:no exudate, no erythema and lips, buccal mucosa, and tongue normal  NECK: supple, thyroid normal size, non-tender, without nodularity LYMPH:  no palpable lymphadenopathy in the cervical, axillary or supraclavicular LUNGS: clear to auscultation and percussion with normal breathing effort HEART: regular rate & rhythm and no murmurs and no lower extremity edema ABDOMEN:abdomen soft, non-tender and normal bowel sounds Musculoskeletal:no cyanosis of digits and no clubbing  NEURO: alert & oriented x 3 with fluent speech, no focal motor/sensory deficits  LABORATORY DATA: Results for orders placed in visit on 11/21/13 (from the past 48 hour(s))  CBC WITH DIFFERENTIAL     Status: None   Collection Time    11/21/13  9:50 AM      Result Value Ref Range   WBC 5.9  4.0 - 10.3 10e3/uL   NEUT# 3.7  1.5 - 6.5 10e3/uL   HGB 14.1  13.0 - 17.1 g/dL   HCT 42.2  38.4 - 49.9 %   Platelets 178  140 - 400 10e3/uL   MCV 88.1  79.3 - 98.0 fL   MCH 29.4  27.2 - 33.4 pg   MCHC 33.4  32.0 - 36.0 g/dL   RBC 4.79  4.20 - 5.82 10e6/uL   RDW 13.2  11.0 - 14.6 %   lymph# 1.4  0.9 - 3.3 10e3/uL   MONO# 0.5  0.1 - 0.9 10e3/uL   Eosinophils Absolute 0.2  0.0 - 0.5 10e3/uL   Basophils Absolute 0.1  0.0 - 0.1 10e3/uL   NEUT% 63.1  39.0 - 75.0 %   LYMPH% 24.1  14.0 - 49.0 %   MONO% 8.6  0.0 - 14.0 %   EOS% 3.4  0.0 - 7.0 %   BASO% 0.8  0.0 - 2.0 %  COMPREHENSIVE METABOLIC PANEL (PT46)     Status: Abnormal   Collection Time     11/21/13  9:51 AM      Result Value Ref Range   Sodium 139  136 - 145 mEq/L   Potassium 3.8  3.5 - 5.1 mEq/L   Chloride 107  98 - 109 mEq/L   CO2 24  22 - 29 mEq/L   Glucose 112  70 - 140 mg/dl   BUN 13.4  7.0 - 26.0 mg/dL   Creatinine 0.9  0.7 - 1.3 mg/dL   Total Bilirubin 1.07  0.20 - 1.20 mg/dL   Alkaline Phosphatase 43  40 - 150 U/L   AST 34  5 - 34 U/L   ALT 78 (*) 0 - 55 U/L   Total Protein 6.9  6.4 - 8.3 g/dL   Albumin 3.9  3.5 - 5.0 g/dL   Calcium 9.4  8.4 - 10.4 mg/dL   Anion Gap 8  3 - 11 mEq/L    TSH pending from today.  Results for WILMOT, QUEVEDO (MRN [id number]) as of 05/22/2013  11:15  Ref. Range 05/22/2013 09:45  TSH Latest Range: 0.320-4.118 m(IU)/L 4.355 (H)     RADIOGRAPHIC STUDIES:  CT CHEST W CONTRAST 02/13/2012  RADIOLOGY REPORT*  Clinical Data: Hodgkin's lymphoma  CT CHEST WITH CONTRAST  Technique: Multidetector CT imaging of the chest was performed  following the standard protocol during bolus administration of  intravenous contrast.  Contrast: 40mL OMNIPAQUE IOHEXOL 300 MG/ML SOLN  Comparison: 02/16/2011  Findings: There is no axillary lymphadenopathy. The anterior  mediastinal mass measures slightly smaller today when measuring at  the same level and in the same dimensions and which was measured  previously. The lesion measures 2.3 x 4.5 cm today compared 2.9 x  5.2 cm previously. There is some central dystrophic calcification  within the lesion. No other mediastinal or hilar lymphadenopathy.  Heart size is normal. There is no pericardial or pleural effusion.  Bone windows show no parenchymal nodular mass. There is no focal  airspace consolidation. Subtle ground-glass attenuation in the  posterior left upper lobe may be related to an infectious or  inflammatory alveolitis.  Bone windows reveal no worrisome lytic or sclerotic osseous  lesions.  IMPRESSION:  Interval decrease in the anterior mediastinal mass with central  dystrophic  calcification. Imaging features are compatible with  treated lymphoma.  Subtle small area of ground-glass attenuation in the posterior left  upper lobe may be related to infectious or inflammatory alveolitis.    ASSESSMENT: Garrett Bender 38 y.o. male with a history of Hodgkin's lymphoma - Plan: levothyroxine (SYNTHROID) 50 MCG tablet, CBC with Differential, Comprehensive metabolic panel (Cmet) - [facility], Lactate dehydrogenase (LDH) - [facility]  Hypothyroidism, unspecified hypothyroidism type - Plan: TSH-Thyroid Stimulating Hormone, TSH-Thyroid Stimulating Hormone   PLAN: 1. Hodgkin's lymphoma. --Garrett Bender is out almost 6 years from the time of diagnosis without evidence of disease recurrence. He has no signs of relapse and he continues to do well except for the fatigue and weight gain.  His labs were reviewed and are within normal limits with the exception of elevated ALT.    2. Hypothyroidism. -- We counseled him to continue his synthroid.  His TSH was 4.355 and pending from today. May contribute to weight gain.  3. Obesity.  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  He deferred nutrition recommendations.  He will continue moderate exercise and dieting.  We discussed that obesity has been associated with chronic illnesses including diabetes and hypertension.  He will work on potion size and he walks a lot during night shift.  4. Follow-up.  --We will plan to see Garrett Bender again in 6 months, at which time we will check CBC, chemistries, TSH and LDH. The patient was asked to find a primary care physician. He did get flu shot at work today.  All questions were answered. The patient knows to call the clinic with any problems, questions or concerns. We can certainly see the patient much sooner if necessary.  I spent 15 minutes counseling the patient face to face. The total time spent in the appointment was 20 minutes.    Bernadene Bell,  MD Medical Hematologist/Oncologist [place] Pager: [phone] Office No: [phone]

## 2014-04-04 ENCOUNTER — Telehealth: Payer: Self-pay | Admitting: Hematology and Oncology

## 2014-04-04 NOTE — Telephone Encounter (Signed)
S/w pts wife and she is aware of the new appt with dr Nicholes Mango

## 2014-05-23 ENCOUNTER — Telehealth: Payer: Self-pay | Admitting: Hematology and Oncology

## 2014-05-23 NOTE — Telephone Encounter (Signed)
returned call and lvm for pt to call to r/s

## 2014-05-23 NOTE — Telephone Encounter (Signed)
returned call and r/s appt per pt request.

## 2014-05-26 ENCOUNTER — Ambulatory Visit: Payer: Self-pay | Admitting: Hematology and Oncology

## 2014-05-26 ENCOUNTER — Other Ambulatory Visit: Payer: BC Managed Care – PPO

## 2014-05-26 ENCOUNTER — Other Ambulatory Visit: Payer: Self-pay | Admitting: Hematology and Oncology

## 2014-05-26 DIAGNOSIS — C8190 Hodgkin lymphoma, unspecified, unspecified site: Secondary | ICD-10-CM

## 2014-05-26 DIAGNOSIS — E038 Other specified hypothyroidism: Secondary | ICD-10-CM

## 2014-06-18 ENCOUNTER — Telehealth: Payer: Self-pay | Admitting: Hematology and Oncology

## 2014-06-18 ENCOUNTER — Telehealth: Payer: Self-pay | Admitting: *Deleted

## 2014-06-18 ENCOUNTER — Encounter: Payer: Self-pay | Admitting: Hematology and Oncology

## 2014-06-18 ENCOUNTER — Ambulatory Visit (HOSPITAL_BASED_OUTPATIENT_CLINIC_OR_DEPARTMENT_OTHER): Payer: BLUE CROSS/BLUE SHIELD | Admitting: Hematology and Oncology

## 2014-06-18 ENCOUNTER — Other Ambulatory Visit (HOSPITAL_BASED_OUTPATIENT_CLINIC_OR_DEPARTMENT_OTHER): Payer: BLUE CROSS/BLUE SHIELD

## 2014-06-18 VITALS — BP 119/81 | HR 78 | Temp 98.1°F | Resp 18 | Ht 75.0 in | Wt 301.4 lb

## 2014-06-18 DIAGNOSIS — E038 Other specified hypothyroidism: Secondary | ICD-10-CM

## 2014-06-18 DIAGNOSIS — E039 Hypothyroidism, unspecified: Secondary | ICD-10-CM

## 2014-06-18 DIAGNOSIS — Z8571 Personal history of Hodgkin lymphoma: Secondary | ICD-10-CM

## 2014-06-18 DIAGNOSIS — C8190 Hodgkin lymphoma, unspecified, unspecified site: Secondary | ICD-10-CM

## 2014-06-18 DIAGNOSIS — R748 Abnormal levels of other serum enzymes: Secondary | ICD-10-CM

## 2014-06-18 LAB — CBC WITH DIFFERENTIAL/PLATELET
BASO%: 0.8 % (ref 0.0–2.0)
BASOS ABS: 0.1 10*3/uL (ref 0.0–0.1)
EOS%: 3.1 % (ref 0.0–7.0)
Eosinophils Absolute: 0.2 10*3/uL (ref 0.0–0.5)
HEMATOCRIT: 44.5 % (ref 38.4–49.9)
HEMOGLOBIN: 14.8 g/dL (ref 13.0–17.1)
LYMPH%: 18 % (ref 14.0–49.0)
MCH: 29.1 pg (ref 27.2–33.4)
MCHC: 33.3 g/dL (ref 32.0–36.0)
MCV: 87.5 fL (ref 79.3–98.0)
MONO#: 0.5 10*3/uL (ref 0.1–0.9)
MONO%: 7.7 % (ref 0.0–14.0)
NEUT#: 4.9 10*3/uL (ref 1.5–6.5)
NEUT%: 70.4 % (ref 39.0–75.0)
Platelets: 187 10*3/uL (ref 140–400)
RBC: 5.09 10*6/uL (ref 4.20–5.82)
RDW: 13.4 % (ref 11.0–14.6)
WBC: 7 10*3/uL (ref 4.0–10.3)
lymph#: 1.3 10*3/uL (ref 0.9–3.3)

## 2014-06-18 LAB — COMPREHENSIVE METABOLIC PANEL (CC13)
ALK PHOS: 44 U/L (ref 40–150)
ALT: 87 U/L — AB (ref 0–55)
AST: 47 U/L — AB (ref 5–34)
Albumin: 4.1 g/dL (ref 3.5–5.0)
Anion Gap: 12 mEq/L — ABNORMAL HIGH (ref 3–11)
BILIRUBIN TOTAL: 1.31 mg/dL — AB (ref 0.20–1.20)
BUN: 19.4 mg/dL (ref 7.0–26.0)
CO2: 22 meq/L (ref 22–29)
CREATININE: 0.9 mg/dL (ref 0.7–1.3)
Calcium: 9.3 mg/dL (ref 8.4–10.4)
Chloride: 106 mEq/L (ref 98–109)
EGFR: >90 mL/min/{1.73_m2} (ref >90)
Glucose: 104 mg/dl (ref 70–140)
Potassium: 4.1 mEq/L (ref 3.5–5.1)
SODIUM: 140 meq/L (ref 136–145)
Total Protein: 7.3 g/dL (ref 6.4–8.3)

## 2014-06-18 LAB — TSH CHCC: TSH: 5.399 m(IU)/L — ABNORMAL HIGH (ref 0.320–4.118)

## 2014-06-18 LAB — LACTATE DEHYDROGENASE (CC13): LDH: 224 U/L (ref 125–245)

## 2014-06-18 MED ORDER — LEVOTHYROXINE SODIUM 75 MCG PO TABS: 75.0000 ug | ORAL_TABLET | Freq: Every day | ORAL | Status: DC

## 2014-06-18 NOTE — Telephone Encounter (Signed)
Instructed wife on pt needs to increase Synthroid from 50 mcg daily to 75 mcg.  He can take 1 1/2 tabs of the 50 mcg if he is able but we will also send new Rx for 75 mcg.   Pt to have labs rechecked in 3 months by PCP or here.  Wife asks for [place] to schedule and pt can cancel if he is able to have PCP manage it by then.  She verbalized understanding of instructions.

## 2014-06-18 NOTE — Telephone Encounter (Signed)
-----   Message from Heath Lark, MD sent at 06/18/2014  2:47 PM EDT ----- Regarding: thyroid function Please get him to take 1.5 tabs If not possible, call in script for 75 mcg He needs recheck in 3 months: if he gets new PCP he can be monitored by PCP If not, set up labs for 3 months here ----- Message -----    From: [facility]    Sent: 06/18/2014   9:02 AM      To: Heath Lark, MD

## 2014-06-18 NOTE — Telephone Encounter (Signed)
Gave adn printed appt sched and avs for May 2017

## 2014-06-19 ENCOUNTER — Telehealth: Payer: Self-pay | Admitting: Hematology and Oncology

## 2014-06-19 DIAGNOSIS — R748 Abnormal levels of other serum enzymes: Secondary | ICD-10-CM | POA: Insufficient documentation

## 2014-06-19 NOTE — Assessment & Plan Note (Signed)
His last CT scan was 2 years ago, and at that time he has persistent mediastinal lymphadenopathy. I recommend repeat staging imaging scan one more time to make sure of stability of the mediastinal lymphadenopathy before we discontinued imaging study altogether. If the repeat imaging study was negative, I plan to see him on a yearly basis with history, physical examination and blood work.

## 2014-06-19 NOTE — Progress Notes (Signed)
[facility] progress notes  Patient Care Team: No Pcp Per Patient as PCP - General (General Practice)  CHIEF COMPLAINTS/PURPOSE OF VISIT:  Hodgkin lymphoma  HISTORY OF PRESENTING ILLNESS:  Garrett Bender 39 y.o. male was transferred to my care after his prior physician has left.  I reviewed the patient's records extensive and collaborated the history with the patient. Summary of his history is as follows: This patient was diagnosed with Hodgkin's lymphoma stage II with a very large mediastinal mass, cervical and supraclavicular lymphadenopathy involving the left side of the neck diagnosed by a biopsy of a left supraclavicular lymph node on 05/17/2007. His symptoms consisted of dyspnea, profound night sweats and noisy breathing .The patient's symptoms started in October of 2008 but he did not seek medical attention because he did not have medical insurance. The patient had a large left pleural effusion. He received 6-1/2 cycles of ABVD chemotherapy with an excellent response. Chemotherapy was given from 05/25/2007 through 11/09/2007. He then received radiation treatments 3960 centigray from 11/26/2007 through 12/25/2007. He has remained in complete clinical remission since then.  His treatment was complicated by peripheral neuropathy which have subsequently resolved. He also developed chronic hypothyroidism and is on chronic supplement therapy.  Since he was last seen here, he denies new symptoms. He had progressive weight gain. Denies new lymphadenopathy. No recurrent night sweats. Denies chest pain or shortness of breath.  MEDICAL HISTORY:  Past Medical History  Diagnosis Date  . Hodgkin's lymphoma     2009  . Thyroid disease     SURGICAL HISTORY: History reviewed. No pertinent past surgical history.  SOCIAL HISTORY: History   Social History  . Marital Status: Married    Spouse Name: N/A  . Number of Children: N/A  . Years of Education: N/A    Occupational History  . Not on file.   Social History Main Topics  . Smoking status: Never Smoker   . Smokeless tobacco: Never Used  . Alcohol Use: No  . Drug Use: No  . Sexual Activity: Not on file   Other Topics Concern  . Not on file   Social History Narrative    FAMILY HISTORY: Family History  Problem Relation Age of Onset  . Diabetes Father   . Heart attack Father 45  . Diabetes Maternal Grandfather   . Cancer Maternal Grandmother     lung ca  . Cancer Paternal Grandmother     breast ca    ALLERGIES:  has No Known Allergies.  MEDICATIONS:  Current Outpatient Prescriptions  Medication Sig Dispense Refill  . levothyroxine (SYNTHROID) 75 MCG tablet Take 1 tablet (75 mcg total) by mouth daily before breakfast. 30 tablet 2  . mupirocin ointment (BACTROBAN) 2 % Apply 1 application topically 3 (three) times daily as needed. To backs of fingers    . triamcinolone (KENALOG) 0.025 % cream Apply topically 2 (two) times daily as needed. Bid to tid for 14 days to backs of fingers     No current facility-administered medications for this visit.    REVIEW OF SYSTEMS:   Constitutional: Denies fevers, chills or abnormal night sweats Eyes: Denies blurriness of vision, double vision or watery eyes Ears, nose, mouth, throat, and face: Denies mucositis or sore throat Respiratory: Denies cough, dyspnea or wheezes Cardiovascular: Denies palpitation, chest discomfort or lower extremity swelling Gastrointestinal:  Denies nausea, heartburn or change in bowel habits Skin: Denies abnormal skin rashes Lymphatics: Denies new lymphadenopathy or easy bruising Neurological:Denies numbness, tingling or  new weaknesses Behavioral/Psych: Mood is stable, no new changes  All other systems were reviewed with the patient and are negative.  PHYSICAL EXAMINATION: ECOG PERFORMANCE STATUS: 0 - Asymptomatic  Filed Vitals:   06/18/14 0914  BP: 119/81  Pulse: 78  Temp: 98.1 F (36.7 C)  Resp:  18   Filed Weights   06/18/14 0914  Weight: 301 lb 6.4 oz (136.714 kg)    GENERAL:alert, no distress and comfortable. He is morbidly obese SKIN: skin color, texture, turgor are normal, no rashes or significant lesions EYES: normal, conjunctiva are pink and non-injected, sclera clear OROPHARYNX:no exudate, normal lips, buccal mucosa, and tongue  NECK: supple, thyroid normal size, non-tender, without nodularity LYMPH:  no palpable lymphadenopathy in the cervical, axillary or inguinal LUNGS: clear to auscultation and percussion with normal breathing effort HEART: regular rate & rhythm and no murmurs without lower extremity edema ABDOMEN:abdomen soft, non-tender and normal bowel sounds Musculoskeletal:no cyanosis of digits and no clubbing  PSYCH: alert & oriented x 3 with fluent speech NEURO: no focal motor/sensory deficits  LABORATORY DATA:  I have reviewed the data as listed Lab Results  Component Value Date   WBC 7.0 06/18/2014   HGB 14.8 06/18/2014   HCT 44.5 06/18/2014   MCV 87.5 06/18/2014   PLT 187 06/18/2014    Recent Labs  11/21/13 0951 06/18/14 0848  NA 139 140  K 3.8 4.1  CO2 24 22  GLUCOSE 112 104  BUN 13.4 19.4  CREATININE 0.9 0.9  CALCIUM 9.4 9.3  PROT 6.9 7.3  ALBUMIN 3.9 4.1  AST 34 47*  ALT 78* 87*  ALKPHOS 43 44  BILITOT 1.07 1.31*    RADIOGRAPHIC STUDIES: I reviewed all his serial imaging study I have personally reviewed the radiological images as listed and agreed with the findings in the report.  ASSESSMENT & PLAN:  Hodgkin's lymphoma His last CT scan was 2 years ago, and at that time he has persistent mediastinal lymphadenopathy. I recommend repeat staging imaging scan one more time to make sure of stability of the mediastinal lymphadenopathy before we discontinued imaging study altogether. If the repeat imaging study was negative, I plan to see him on a yearly basis with history, physical examination and blood work.   Hypothyroid He has  significant hypothyroidism and was inadequately treated. He had progressive weight gain. I recommended increasing the levothyroxine to 75 g and recheck in 3 months.   Elevated liver enzymes I suspect he may have fatty liver disease from morbid obesity and adequately treated hypothyroidism. I will monitor closely. I recommend dietary modification, exercise and weight loss.      Orders Placed This Encounter  Procedures  . CT Chest W Contrast    Standing Status: Future     Number of Occurrences:      Standing Expiration Date: 08/18/2015    Order Specific Question:  Reason for Exam (SYMPTOM  OR DIAGNOSIS REQUIRED)    Answer:  staging lymphoma    Order Specific Question:  Preferred imaging location?    Answer:  [facility]  . CBC with Differential/Platelet    Standing Status: Future     Number of Occurrences:      Standing Expiration Date: 07/23/2015  . Comprehensive metabolic panel    Standing Status: Future     Number of Occurrences:      Standing Expiration Date: 07/23/2015  . Lactate dehydrogenase    Standing Status: Future     Number of Occurrences:  Standing Expiration Date: 07/23/2015  . TSH    Standing Status: Future     Number of Occurrences:      Standing Expiration Date: 07/23/2015    All questions were answered. The patient knows to call the clinic with any problems, questions or concerns. I spent 30 minutes counseling the patient face to face. The total time spent in the appointment was 40 minutes and more than 50% was on counseling.     [place], [place], MD 06/19/2014 12:12 PM

## 2014-06-19 NOTE — Telephone Encounter (Signed)
s.w. pt wife and advised on Aug lab...ok and aware

## 2014-06-19 NOTE — Assessment & Plan Note (Addendum)
I suspect he may have fatty liver disease from morbid obesity and adequately treated hypothyroidism. I will monitor closely. I recommend dietary modification, exercise and weight loss.

## 2014-06-19 NOTE — Assessment & Plan Note (Signed)
He has significant hypothyroidism and was inadequately treated. He had progressive weight gain. I recommended increasing the levothyroxine to 75 g and recheck in 3 months.

## 2014-06-25 ENCOUNTER — Ambulatory Visit (HOSPITAL_COMMUNITY)
Admission: RE | Admit: 2014-06-25 | Discharge: 2014-06-25 | Disposition: A | Discharge status: Home / Self Care | Payer: BLUE CROSS/BLUE SHIELD | Source: Ambulatory Visit | Attending: Hematology and Oncology | Admitting: Hematology and Oncology

## 2014-06-25 ENCOUNTER — Encounter (HOSPITAL_COMMUNITY): Payer: Self-pay

## 2014-06-25 DIAGNOSIS — Z923 Personal history of irradiation: Secondary | ICD-10-CM | POA: Diagnosis not present

## 2014-06-25 DIAGNOSIS — Z9221 Personal history of antineoplastic chemotherapy: Secondary | ICD-10-CM | POA: Insufficient documentation

## 2014-06-25 DIAGNOSIS — C8190 Hodgkin lymphoma, unspecified, unspecified site: Secondary | ICD-10-CM | POA: Diagnosis not present

## 2014-06-25 MED ORDER — IOHEXOL 300 MG/ML  SOLN
80.0000 mL | Freq: Once | INTRAMUSCULAR | Status: AC | PRN
  Administered 2014-06-25: 80 mL via INTRAVENOUS

## 2014-07-01 ENCOUNTER — Telehealth: Payer: Self-pay | Admitting: *Deleted

## 2014-07-01 ENCOUNTER — Other Ambulatory Visit: Payer: Self-pay | Admitting: Hematology and Oncology

## 2014-07-01 DIAGNOSIS — C8190 Hodgkin lymphoma, unspecified, unspecified site: Secondary | ICD-10-CM

## 2014-07-01 NOTE — Telephone Encounter (Signed)
-----   Message from Heath Lark, MD sent at 07/01/2014 12:11 PM EDT ----- Regarding: Ct scan Pls call him and let him know radiologist recommend PET scan. I ordered it for next week and placed POF to see him back on 6/8 to review it with him

## 2014-07-01 NOTE — Telephone Encounter (Signed)
Informed pt of PET ordered and f/u visit w/ Dr. Alvy Bimler.  Expect call from [facility] and our scheduling dept.. For dates/times.  He verbalized understanding.

## 2014-07-02 ENCOUNTER — Telehealth: Payer: Self-pay | Admitting: Hematology and Oncology

## 2014-07-02 NOTE — Telephone Encounter (Signed)
s.w. pt and advised on JUNE appt.....pt ok and aware °

## 2014-07-07 ENCOUNTER — Ambulatory Visit (HOSPITAL_COMMUNITY)
Admission: RE | Admit: 2014-07-07 | Discharge: 2014-07-07 | Disposition: A | Discharge status: Home / Self Care | Payer: BLUE CROSS/BLUE SHIELD | Source: Ambulatory Visit | Attending: Hematology and Oncology | Admitting: Hematology and Oncology

## 2014-07-07 DIAGNOSIS — C8190 Hodgkin lymphoma, unspecified, unspecified site: Secondary | ICD-10-CM

## 2014-07-07 LAB — GLUCOSE, CAPILLARY: GLUCOSE-CAPILLARY: 86 mg/dL (ref 65–99)

## 2014-07-07 MED ORDER — FLUDEOXYGLUCOSE F - 18 (FDG) INJECTION
14.7000 | Freq: Once | INTRAVENOUS | Status: AC | PRN
  Administered 2014-07-07: 14.7 via INTRAVENOUS

## 2014-07-08 ENCOUNTER — Telehealth: Payer: Self-pay | Admitting: Hematology and Oncology

## 2014-07-08 NOTE — Telephone Encounter (Signed)
Reviewed Ct and PET scan; results excluded lymphoma recurrence I will cancel his appt tomorrow Addressed all his questions

## 2014-07-09 ENCOUNTER — Ambulatory Visit: Payer: BLUE CROSS/BLUE SHIELD | Admitting: Hematology and Oncology

## 2014-09-10 ENCOUNTER — Telehealth: Payer: Self-pay | Admitting: *Deleted

## 2014-09-10 ENCOUNTER — Other Ambulatory Visit (HOSPITAL_BASED_OUTPATIENT_CLINIC_OR_DEPARTMENT_OTHER): Payer: BLUE CROSS/BLUE SHIELD

## 2014-09-10 DIAGNOSIS — C8190 Hodgkin lymphoma, unspecified, unspecified site: Secondary | ICD-10-CM | POA: Diagnosis not present

## 2014-09-10 DIAGNOSIS — E039 Hypothyroidism, unspecified: Secondary | ICD-10-CM | POA: Diagnosis not present

## 2014-09-10 LAB — COMPREHENSIVE METABOLIC PANEL (CC13)
ALBUMIN: 4 g/dL (ref 3.5–5.0)
ALT: 78 U/L — ABNORMAL HIGH (ref 0–55)
AST: 34 U/L (ref 5–34)
Alkaline Phosphatase: 50 U/L (ref 40–150)
Anion Gap: 7 mEq/L (ref 3–11)
BUN: 13.9 mg/dL (ref 7.0–26.0)
CALCIUM: 9.9 mg/dL (ref 8.4–10.4)
CHLORIDE: 108 meq/L (ref 98–109)
CO2: 26 mEq/L (ref 22–29)
Creatinine: 1 mg/dL (ref 0.7–1.3)
EGFR: >90 mL/min/{1.73_m2} (ref >90)
Glucose: 130 mg/dl (ref 70–140)
Potassium: 3.9 mEq/L (ref 3.5–5.1)
Sodium: 140 mEq/L (ref 136–145)
Total Bilirubin: 1.3 mg/dL — ABNORMAL HIGH (ref 0.20–1.20)
Total Protein: 7.2 g/dL (ref 6.4–8.3)

## 2014-09-10 LAB — TSH CHCC: TSH: 4.17 m(IU)/L — ABNORMAL HIGH (ref 0.320–4.118)

## 2014-09-10 LAB — CBC WITH DIFFERENTIAL/PLATELET
BASO%: 0.6 % (ref 0.0–2.0)
BASOS ABS: 0 10*3/uL (ref 0.0–0.1)
EOS ABS: 0.2 10*3/uL (ref 0.0–0.5)
EOS%: 3.2 % (ref 0.0–7.0)
HCT: 44.8 % (ref 38.4–49.9)
HEMOGLOBIN: 15 g/dL (ref 13.0–17.1)
LYMPH%: 19.1 % (ref 14.0–49.0)
MCH: 29.3 pg (ref 27.2–33.4)
MCHC: 33.4 g/dL (ref 32.0–36.0)
MCV: 87.8 fL (ref 79.3–98.0)
MONO#: 0.4 10*3/uL (ref 0.1–0.9)
MONO%: 7.4 % (ref 0.0–14.0)
NEUT%: 69.7 % (ref 39.0–75.0)
NEUTROS ABS: 3.8 10*3/uL (ref 1.5–6.5)
PLATELETS: 172 10*3/uL (ref 140–400)
RBC: 5.1 10*6/uL (ref 4.20–5.82)
RDW: 13.5 % (ref 11.0–14.6)
WBC: 5.5 10*3/uL (ref 4.0–10.3)
lymph#: 1.1 10*3/uL (ref 0.9–3.3)

## 2014-09-10 LAB — LACTATE DEHYDROGENASE (CC13): LDH: 194 U/L (ref 125–245)

## 2014-09-10 MED ORDER — LEVOTHYROXINE SODIUM 75 MCG PO TABS: 75.0000 ug | ORAL_TABLET | Freq: Every day | ORAL | Status: DC

## 2014-09-10 NOTE — Telephone Encounter (Signed)
Informed pt of Dr. Calton Dach note below and he verbalized understanding.  Needs refill sent to [facility].  I sent refill 30 days plus 3 refills.  He also asks for referral to see Endocrinologist Dr. Chalmers Cater to manage his hypothyroidism in the future.  His wife sees Dr. Chalmers Cater and pt would also like to see her but says he needs a referral from Dr. Alvy Bimler to see Dr. Chalmers Cater at [facility].

## 2014-09-10 NOTE — Telephone Encounter (Signed)
-----   Message from Heath Lark, MD sent at 09/10/2014 12:07 PM EDT ----- Regarding: TSH Tsh is better Continue the same  ----- Message -----    From: [facility]    Sent: 09/10/2014  10:28 AM      To: Heath Lark, MD

## 2014-09-11 ENCOUNTER — Other Ambulatory Visit: Payer: Self-pay | Admitting: Hematology and Oncology

## 2014-09-11 DIAGNOSIS — E038 Other specified hypothyroidism: Secondary | ICD-10-CM

## 2014-09-11 NOTE — Telephone Encounter (Signed)
I placed order

## 2015-05-22 DIAGNOSIS — L03119 Cellulitis of unspecified part of limb: Secondary | ICD-10-CM | POA: Diagnosis not present

## 2015-06-19 ENCOUNTER — Encounter: Payer: Self-pay | Admitting: Hematology and Oncology

## 2015-06-19 ENCOUNTER — Other Ambulatory Visit (HOSPITAL_BASED_OUTPATIENT_CLINIC_OR_DEPARTMENT_OTHER): Payer: BLUE CROSS/BLUE SHIELD

## 2015-06-19 ENCOUNTER — Ambulatory Visit (HOSPITAL_BASED_OUTPATIENT_CLINIC_OR_DEPARTMENT_OTHER): Payer: BLUE CROSS/BLUE SHIELD | Admitting: Hematology and Oncology

## 2015-06-19 ENCOUNTER — Telehealth: Payer: Self-pay | Admitting: Hematology and Oncology

## 2015-06-19 ENCOUNTER — Telehealth: Payer: Self-pay | Admitting: *Deleted

## 2015-06-19 ENCOUNTER — Other Ambulatory Visit: Payer: Self-pay | Admitting: Hematology and Oncology

## 2015-06-19 VITALS — BP 117/81 | HR 80 | Temp 98.0°F | Resp 20 | Ht 75.0 in | Wt 290.3 lb

## 2015-06-19 DIAGNOSIS — E038 Other specified hypothyroidism: Secondary | ICD-10-CM

## 2015-06-19 DIAGNOSIS — Z8572 Personal history of non-Hodgkin lymphomas: Secondary | ICD-10-CM

## 2015-06-19 DIAGNOSIS — L709 Acne, unspecified: Secondary | ICD-10-CM | POA: Insufficient documentation

## 2015-06-19 DIAGNOSIS — L708 Other acne: Secondary | ICD-10-CM

## 2015-06-19 DIAGNOSIS — E039 Hypothyroidism, unspecified: Secondary | ICD-10-CM

## 2015-06-19 DIAGNOSIS — Z8579 Personal history of other malignant neoplasms of lymphoid, hematopoietic and related tissues: Secondary | ICD-10-CM

## 2015-06-19 DIAGNOSIS — R635 Abnormal weight gain: Secondary | ICD-10-CM | POA: Diagnosis not present

## 2015-06-19 DIAGNOSIS — Z515 Encounter for palliative care: Secondary | ICD-10-CM

## 2015-06-19 HISTORY — DX: Acne, unspecified: L70.9

## 2015-06-19 LAB — TSH: TSH: 6.044 m(IU)/L — ABNORMAL HIGH (ref 0.320–4.118)

## 2015-06-19 LAB — CBC WITH DIFFERENTIAL/PLATELET
BASO%: 0.9 % (ref 0.0–2.0)
Basophils Absolute: 0.1 10*3/uL (ref 0.0–0.1)
EOS%: 4.3 % (ref 0.0–7.0)
Eosinophils Absolute: 0.2 10*3/uL (ref 0.0–0.5)
HCT: 44.3 % (ref 38.4–49.9)
HGB: 14.7 g/dL (ref 13.0–17.1)
LYMPH%: 39.7 % (ref 14.0–49.0)
MCH: 29.5 pg (ref 27.2–33.4)
MCHC: 33.2 g/dL (ref 32.0–36.0)
MCV: 88.8 fL (ref 79.3–98.0)
MONO#: 0.4 10*3/uL (ref 0.1–0.9)
MONO%: 6.5 % (ref 0.0–14.0)
NEUT#: 2.6 10*3/uL (ref 1.5–6.5)
NEUT%: 48.6 % (ref 39.0–75.0)
Platelets: 169 10*3/uL (ref 140–400)
RBC: 4.99 10*6/uL (ref 4.20–5.82)
RDW: 13.4 % (ref 11.0–14.6)
WBC: 5.4 10*3/uL (ref 4.0–10.3)
lymph#: 2.2 10*3/uL (ref 0.9–3.3)

## 2015-06-19 LAB — COMPREHENSIVE METABOLIC PANEL
ALBUMIN: 4 g/dL (ref 3.5–5.0)
ALK PHOS: 44 U/L (ref 40–150)
ALT: 50 U/L (ref 0–55)
AST: 27 U/L (ref 5–34)
Anion Gap: 8 mEq/L (ref 3–11)
BILIRUBIN TOTAL: 1.61 mg/dL — AB (ref 0.20–1.20)
BUN: 18.1 mg/dL (ref 7.0–26.0)
CO2: 23 meq/L (ref 22–29)
Calcium: 9 mg/dL (ref 8.4–10.4)
Chloride: 109 mEq/L (ref 98–109)
Creatinine: 0.9 mg/dL (ref 0.7–1.3)
EGFR: >90 mL/min/{1.73_m2} (ref >90)
Glucose: 158 mg/dl — ABNORMAL HIGH (ref 70–140)
Potassium: 3.8 mEq/L (ref 3.5–5.1)
SODIUM: 140 meq/L (ref 136–145)
TOTAL PROTEIN: 7 g/dL (ref 6.4–8.3)

## 2015-06-19 MED ORDER — MINOCYCLINE HCL 50 MG PO TABS: 50.0000 mg | ORAL_TABLET | Freq: Two times a day (BID) | ORAL | Status: DC

## 2015-06-19 MED ORDER — LEVOTHYROXINE SODIUM 75 MCG PO TABS: 75.0000 ug | ORAL_TABLET | Freq: Every day | ORAL | Status: DC

## 2015-06-19 NOTE — Telephone Encounter (Signed)
-----   Message from Heath Lark, MD sent at 06/19/2015  5:01 PM EDT ----- Regarding: TSH PLs call pateint with results TSH is high means we need to increase levothyroxine to 100 mcg Please call in to his pharmacy for 3 months supply, no refills He need repeat test with endocrinologist

## 2015-06-19 NOTE — Telephone Encounter (Signed)
Gave pt avs °

## 2015-06-19 NOTE — Telephone Encounter (Signed)
Continue current dose then Recheck 3 months OK to refill same dose Please document

## 2015-06-19 NOTE — Progress Notes (Signed)
[facility] OFFICE PROGRESS NOTE  Patient Care Team: No Pcp Per Patient as PCP - General (General Practice)  SUMMARY OF ONCOLOGIC HISTORY:  Garrett Bender was transferred to my care after his prior physician has left.  I reviewed the patient's records extensive and collaborated the history with the patient. Summary of his history is as follows: This patient was diagnosed with Hodgkin's lymphoma stage II with a very large mediastinal mass, cervical and supraclavicular lymphadenopathy involving the left side of the neck diagnosed by a biopsy of a left supraclavicular lymph node on 05/17/2007. His symptoms consisted of dyspnea, profound night sweats and noisy breathing .The patient's symptoms started in October of 2008 but he did not seek medical attention because he did not have medical insurance. The patient had a large left pleural effusion. He received 6-1/2 cycles of ABVD chemotherapy with an excellent response. Chemotherapy was given from 05/25/2007 through 11/09/2007. He then received radiation treatments 3960 centigray from 11/26/2007 through 12/25/2007. He has remained in complete clinical remission since then.  His treatment was complicated by peripheral neuropathy which have subsequently resolved. He also developed chronic hypothyroidism and is on chronic supplement therapy.   INTERVAL HISTORY: Please see below for problem oriented charting. He has not get established with endocrinologist yet Since he was last seen here, he denies new symptoms. He had progressive weight gain. Denies new lymphadenopathy. No recurrent night sweats. Denies chest pain or shortness of breath. He complained of recurrent skin infection on his face and other areas. He is undergoing a lot of stress at work and has twins at home  REVIEW OF SYSTEMS:   Constitutional: Denies fevers, chills or abnormal weight loss Eyes: Denies blurriness of vision Ears, nose, mouth, throat, and face: Denies  mucositis or sore throat Respiratory: Denies cough, dyspnea or wheezes Cardiovascular: Denies palpitation, chest discomfort or lower extremity swelling Gastrointestinal:  Denies nausea, heartburn or change in bowel habits Lymphatics: Denies new lymphadenopathy or easy bruising Neurological:Denies numbness, tingling or new weaknesses Behavioral/Psych: Mood is stable, no new changes  All other systems were reviewed with the patient and are negative.  I have reviewed the past medical history, past surgical history, social history and family history with the patient and they are unchanged from previous note.  ALLERGIES:  has No Known Allergies.  MEDICATIONS:  Current Outpatient Prescriptions  Medication Sig Dispense Refill  . levothyroxine (SYNTHROID) 75 MCG tablet Take 1 tablet (75 mcg total) by mouth daily before breakfast. (Patient not taking: Reported on 06/19/2015) 30 tablet 3  . minocycline (DYNACIN) 50 MG tablet Take 1 tablet (50 mg total) by mouth 2 (two) times daily. 60 tablet 2  . mupirocin ointment (BACTROBAN) 2 % Apply 1 application topically 3 (three) times daily as needed. Reported on 06/19/2015    . triamcinolone (KENALOG) 0.025 % cream Apply topically 2 (two) times daily as needed. Reported on 06/19/2015     No current facility-administered medications for this visit.    PHYSICAL EXAMINATION: ECOG PERFORMANCE STATUS: 0 - Asymptomatic  Filed Vitals:   06/19/15 1014  BP: 117/81  Pulse: 80  Temp: 98 F (36.7 C)  Resp: 20   Filed Weights   06/19/15 1014  Weight: 290 lb 4.8 oz (131.679 kg)    GENERAL:alert, no distress and comfortable. He is obese SKIN: Noted significant acne and skin infection. No pus collection EYES: normal, Conjunctiva are pink and non-injected, sclera clear OROPHARYNX:no exudate, no erythema and lips, buccal mucosa, and tongue normal  NECK: supple,  thyroid normal size, non-tender, without nodularity LYMPH:  no palpable lymphadenopathy in the  cervical, axillary or inguinal LUNGS: clear to auscultation and percussion with normal breathing effort HEART: regular rate & rhythm and no murmurs and no lower extremity edema ABDOMEN:abdomen soft, non-tender and normal bowel sounds Musculoskeletal:no cyanosis of digits and no clubbing  NEURO: alert & oriented x 3 with fluent speech, no focal motor/sensory deficits  LABORATORY DATA:  I have reviewed the data as listed    Component Value Date/Time   NA 140 06/19/2015 1004   NA 140 06/17/2011 1313   K 3.8 06/19/2015 1004   K 4.0 06/17/2011 1313   CL 106 02/21/2012 1522   CL 107 06/17/2011 1313   CO2 23 06/19/2015 1004   CO2 25 06/17/2011 1313   GLUCOSE 158* 06/19/2015 1004   GLUCOSE 113* 02/21/2012 1522   GLUCOSE 107* 06/17/2011 1313   BUN 18.1 06/19/2015 1004   BUN 18 06/17/2011 1313   CREATININE 0.9 06/19/2015 1004   CREATININE 0.94 06/17/2011 1313   CALCIUM 9.0 06/19/2015 1004   CALCIUM 9.0 06/17/2011 1313   PROT 7.0 06/19/2015 1004   PROT 6.7 06/17/2011 1313   ALBUMIN 4.0 06/19/2015 1004   ALBUMIN 4.3 06/17/2011 1313   AST 27 06/19/2015 1004   AST 19 06/17/2011 1313   ALT 50 06/19/2015 1004   ALT 21 06/17/2011 1313   ALKPHOS 44 06/19/2015 1004   ALKPHOS 45 06/17/2011 1313   BILITOT 1.61* 06/19/2015 1004   BILITOT 0.9 06/17/2011 1313   GFRNONAA >60 05/17/2007 0320   GFRAA  05/17/2007 0320    >60        The eGFR has been calculated using the MDRD equation. This calculation has not been validated in all clinical    No results found for: SPEP, UPEP  Lab Results  Component Value Date   WBC 5.4 06/19/2015   NEUTROABS 2.6 06/19/2015   HGB 14.7 06/19/2015   HCT 44.3 06/19/2015   MCV 88.8 06/19/2015   PLT 169 06/19/2015      Chemistry      Component Value Date/Time   NA 140 06/19/2015 1004   NA 140 06/17/2011 1313   K 3.8 06/19/2015 1004   K 4.0 06/17/2011 1313   CL 106 02/21/2012 1522   CL 107 06/17/2011 1313   CO2 23 06/19/2015 1004   CO2 25  06/17/2011 1313   BUN 18.1 06/19/2015 1004   BUN 18 06/17/2011 1313   CREATININE 0.9 06/19/2015 1004   CREATININE 0.94 06/17/2011 1313      Component Value Date/Time   CALCIUM 9.0 06/19/2015 1004   CALCIUM 9.0 06/17/2011 1313   ALKPHOS 44 06/19/2015 1004   ALKPHOS 45 06/17/2011 1313   AST 27 06/19/2015 1004   AST 19 06/17/2011 1313   ALT 50 06/19/2015 1004   ALT 21 06/17/2011 1313   BILITOT 1.61* 06/19/2015 1004   BILITOT 0.9 06/17/2011 1313     ASSESSMENT & PLAN:  History of lymphoma His last CT scan show no evidence of active disease I will transition his care to cancer survivorship clinic.  Hypothyroid He has significant hypothyroidism and was inadequately treated. He had progressive weight gain. Repeat thyroid function test showed elevated TSH. I plan to increase thyroid replacement therapy 100 g daily. He needs to get his blood work recheck in 3 months   Acne We discussed extensively about skin care and the pros and cons of oral antibiotic therapy. After extensive discussion, he agreed to proceed  with a trial of oral antibiotic with minocycline.  Quality of life palliative care encounter We discussed increase physical activity and possible enrollment with local gym We discussed importance of vitamin D supplementation.    Orders Placed This Encounter  Procedures  . CBC with Differential/Platelet    Standing Status: Future     Number of Occurrences:      Standing Expiration Date: 07/23/2016  . Comprehensive metabolic panel    Standing Status: Future     Number of Occurrences:      Standing Expiration Date: 07/23/2016  . TSH    Standing Status: Future     Number of Occurrences:      Standing Expiration Date: 07/23/2016  . Amb Referral to Survivorship Long term    Referral Priority:  Routine    Referral Type:  Consultation    Referred to Provider:  Holley Bouche, NP    Number of Visits Requested:  1   All questions were answered. The patient knows to  call the clinic with any problems, questions or concerns. No barriers to learning was detected. I spent 20 minutes counseling the patient face to face. The total time spent in the appointment was 25 minutes and more than 50% was on counseling and review of test results     [facility], [place], MD 06/19/2015 5:02 PM

## 2015-06-19 NOTE — Telephone Encounter (Signed)
Informed pt's wife of Dr. Calton Dach message below.  She reports pt not consistently taking Levothyroxine every day and sometimes misses a week at a time.  He is currently out of medication altogether.  Informed her in that case Dr. Alvy Bimler will not increase dose but stay at current dose and take every day.  Follow up w/ Endocrinologist.  Wife says pt does not have Endocrinologist but she sees Dr. Chalmers Cater and would to have pt referred to Dr. Chalmers Cater.

## 2015-06-19 NOTE — Assessment & Plan Note (Signed)
We discussed increase physical activity and possible enrollment with local gym We discussed importance of vitamin D supplementation.

## 2015-06-19 NOTE — Assessment & Plan Note (Signed)
His last CT scan show no evidence of active disease I will transition his care to cancer survivorship clinic.

## 2015-06-19 NOTE — Assessment & Plan Note (Signed)
We discussed extensively about skin care and the pros and cons of oral antibiotic therapy. After extensive discussion, he agreed to proceed with a trial of oral antibiotic with minocycline.

## 2015-06-19 NOTE — Assessment & Plan Note (Signed)
He has significant hypothyroidism and was inadequately treated. He had progressive weight gain. Repeat thyroid function test showed elevated TSH. I plan to increase thyroid replacement therapy 100 g daily. He needs to get his blood work recheck in 3 months

## 2015-06-22 ENCOUNTER — Other Ambulatory Visit: Payer: Self-pay | Admitting: *Deleted

## 2015-06-22 DIAGNOSIS — E038 Other specified hypothyroidism: Secondary | ICD-10-CM

## 2015-06-22 NOTE — Progress Notes (Signed)
Faxed Referral request to Dr. Almetta Lovely office at [facility] fax [phone] along w/ recent labs and office notes.

## 2015-06-24 ENCOUNTER — Telehealth: Payer: Self-pay | Admitting: *Deleted

## 2015-06-24 NOTE — Telephone Encounter (Signed)
Received Note from [facility] they have scheduled pt for new pt appt w/ Dr. Chalmers Cater, Endocrinologist, on 08/12/15 at 3;30 am..   S/w wife and she is aware of appointment.

## 2015-08-12 DIAGNOSIS — E039 Hypothyroidism, unspecified: Secondary | ICD-10-CM | POA: Diagnosis not present

## 2015-09-23 DIAGNOSIS — E039 Hypothyroidism, unspecified: Secondary | ICD-10-CM | POA: Diagnosis not present

## 2015-09-25 ENCOUNTER — Other Ambulatory Visit: Payer: Self-pay | Admitting: Hematology and Oncology

## 2015-09-25 DIAGNOSIS — L708 Other acne: Secondary | ICD-10-CM

## 2015-10-19 DIAGNOSIS — J069 Acute upper respiratory infection, unspecified: Secondary | ICD-10-CM | POA: Diagnosis not present

## 2015-10-22 ENCOUNTER — Other Ambulatory Visit: Payer: Self-pay | Admitting: Hematology and Oncology

## 2015-10-22 DIAGNOSIS — L708 Other acne: Secondary | ICD-10-CM

## 2015-11-27 DIAGNOSIS — E039 Hypothyroidism, unspecified: Secondary | ICD-10-CM | POA: Diagnosis not present

## 2016-01-28 DIAGNOSIS — E039 Hypothyroidism, unspecified: Secondary | ICD-10-CM | POA: Diagnosis not present

## 2016-02-21 ENCOUNTER — Telehealth: Payer: Self-pay | Admitting: Adult Health

## 2016-02-21 NOTE — Telephone Encounter (Signed)
Lvm advising appt 5/21 @ 8am. Also, mailed AVS.

## 2016-02-22 ENCOUNTER — Other Ambulatory Visit: Payer: Self-pay | Admitting: Adult Health

## 2016-02-22 DIAGNOSIS — E038 Other specified hypothyroidism: Secondary | ICD-10-CM

## 2016-02-22 DIAGNOSIS — Z8579 Personal history of other malignant neoplasms of lymphoid, hematopoietic and related tissues: Secondary | ICD-10-CM

## 2016-02-22 DIAGNOSIS — Z8572 Personal history of non-Hodgkin lymphomas: Secondary | ICD-10-CM

## 2016-04-01 DIAGNOSIS — J018 Other acute sinusitis: Secondary | ICD-10-CM | POA: Diagnosis not present

## 2016-06-20 ENCOUNTER — Other Ambulatory Visit (HOSPITAL_BASED_OUTPATIENT_CLINIC_OR_DEPARTMENT_OTHER): Payer: BLUE CROSS/BLUE SHIELD

## 2016-06-20 ENCOUNTER — Ambulatory Visit (HOSPITAL_BASED_OUTPATIENT_CLINIC_OR_DEPARTMENT_OTHER): Payer: BLUE CROSS/BLUE SHIELD | Admitting: Adult Health

## 2016-06-20 ENCOUNTER — Encounter: Payer: Self-pay | Admitting: Adult Health

## 2016-06-20 VITALS — BP 112/83 | HR 76 | Temp 98.0°F | Resp 17 | Wt 308.1 lb

## 2016-06-20 DIAGNOSIS — Z8579 Personal history of other malignant neoplasms of lymphoid, hematopoietic and related tissues: Secondary | ICD-10-CM

## 2016-06-20 DIAGNOSIS — E038 Other specified hypothyroidism: Secondary | ICD-10-CM

## 2016-06-20 DIAGNOSIS — E039 Hypothyroidism, unspecified: Secondary | ICD-10-CM

## 2016-06-20 DIAGNOSIS — Z8571 Personal history of Hodgkin lymphoma: Secondary | ICD-10-CM

## 2016-06-20 DIAGNOSIS — R635 Abnormal weight gain: Secondary | ICD-10-CM

## 2016-06-20 LAB — COMPREHENSIVE METABOLIC PANEL
ALT: 71 U/L — AB (ref 0–55)
ANION GAP: 10 meq/L (ref 3–11)
AST: 35 U/L — ABNORMAL HIGH (ref 5–34)
Albumin: 3.9 g/dL (ref 3.5–5.0)
Alkaline Phosphatase: 44 U/L (ref 40–150)
BILIRUBIN TOTAL: 1.32 mg/dL — AB (ref 0.20–1.20)
BUN: 14.3 mg/dL (ref 7.0–26.0)
CALCIUM: 8.8 mg/dL (ref 8.4–10.4)
CO2: 23 mEq/L (ref 22–29)
CREATININE: 0.9 mg/dL (ref 0.7–1.3)
Chloride: 109 mEq/L (ref 98–109)
EGFR: >90 mL/min/{1.73_m2} (ref >90)
Glucose: 102 mg/dl (ref 70–140)
Potassium: 4 mEq/L (ref 3.5–5.1)
Sodium: 142 mEq/L (ref 136–145)
TOTAL PROTEIN: 6.9 g/dL (ref 6.4–8.3)

## 2016-06-20 LAB — CBC WITH DIFFERENTIAL/PLATELET
BASO%: 1.2 % (ref 0.0–2.0)
Basophils Absolute: 0.1 10*3/uL (ref 0.0–0.1)
EOS%: 3.8 % (ref 0.0–7.0)
Eosinophils Absolute: 0.2 10*3/uL (ref 0.0–0.5)
HEMATOCRIT: 43.3 % (ref 38.4–49.9)
HGB: 14.6 g/dL (ref 13.0–17.1)
LYMPH#: 2 10*3/uL (ref 0.9–3.3)
LYMPH%: 36.7 % (ref 14.0–49.0)
MCH: 29.7 pg (ref 27.2–33.4)
MCHC: 33.8 g/dL (ref 32.0–36.0)
MCV: 87.9 fL (ref 79.3–98.0)
MONO#: 0.5 10*3/uL (ref 0.1–0.9)
MONO%: 9.2 % (ref 0.0–14.0)
NEUT%: 49.1 % (ref 39.0–75.0)
NEUTROS ABS: 2.6 10*3/uL (ref 1.5–6.5)
PLATELETS: 170 10*3/uL (ref 140–400)
RBC: 4.93 10*6/uL (ref 4.20–5.82)
RDW: 13.7 % (ref 11.0–14.6)
WBC: 5.3 10*3/uL (ref 4.0–10.3)

## 2016-06-20 LAB — LACTATE DEHYDROGENASE: LDH: 223 U/L (ref 125–245)

## 2016-06-20 LAB — TSH: TSH: 3.206 m[IU]/L (ref 0.320–4.118)

## 2016-06-20 NOTE — Progress Notes (Signed)
CLINIC:  Survivorship   REASON FOR VISIT:  Routine follow-up for history of Hodgkin's Lymphoma.   BRIEF ONCOLOGIC HISTORY:  Per Dr. Calton Dach last note: This patient was diagnosed with Hodgkin's lymphoma stage II with a very large mediastinal mass, cervical and supraclavicular lymphadenopathy involving the left side of the neck diagnosed by a biopsy of a left supraclavicular lymph node on 05/17/2007. His symptoms consisted of dyspnea, profound night sweats and noisy breathing .The patient's symptoms started in October of 2008 but he did not seek medical attention because he did not have medical insurance. The patient had a large left pleural effusion. He received 6-1/2 cycles of ABVD chemotherapy with an excellent response. Chemotherapy was given from 05/25/2007 through 11/09/2007. He then received radiation treatments 3960 centigray from 11/26/2007 through 12/25/2007. He has remained in complete clinical remission since then.  His treatment was complicated by peripheral neuropathy which have subsequently resolved. He also developed chronic hypothyroidism and is on chronic supplement therapy.  INTERVAL HISTORY:  Mr. Donoghue presents to the Survivorship Clinic today for routine follow-up for his history of lymphoma.  He is doing well today.  Hei s up 18 pounds today.  He does have h/o hypothyroidism.  He is on Synthroid, 165mg daily.  He cannot recall the last time he had it checked.  He thinks 6 months to a year ago.  He is trying to eat healthier and drink less sodas.  He has also changed from working 3rd shift to 1st shift.  He does not have any residual peripheral neuropathy from treatment.  He denies any dyspnea, night sweats, and noisy breathing.  He does not have a primary care doctor.  He does follow with endocrinology for his thyroid.     REVIEW OF SYSTEMS:  Review of Systems  Constitutional: Negative for appetite change, chills, diaphoresis, fatigue, fever and unexpected weight change.    HENT:   Negative for hearing loss and lump/mass.   Eyes: Negative for eye problems and icterus.  Respiratory: Negative for chest tightness, cough and shortness of breath.   Cardiovascular: Negative for chest pain, leg swelling and palpitations.  Gastrointestinal: Negative for abdominal distention and abdominal pain.  Endocrine: Negative for hot flashes.  Musculoskeletal: Negative for arthralgias and back pain.  Neurological: Negative for dizziness, extremity weakness and headaches.  Hematological: Negative for adenopathy. Does not bruise/bleed easily.  Psychiatric/Behavioral: Negative for depression. The patient is not nervous/anxious.         PAST MEDICAL/SURGICAL HISTORY:  Past Medical History:  Diagnosis Date  . Acne 06/19/2015  . History of lymphoma 02/18/2011   Onset on symptoms October 2008.  Diagnosis 05/17/07.  .[place]    [place]  . Thyroid disease    No past surgical history on file.   ALLERGIES:  No Known Allergies   CURRENT MEDICATIONS:  Outpatient Encounter Prescriptions as of 06/20/2016  Medication Sig  . levothyroxine (SYNTHROID) 75 MCG tablet Take 1 tablet (75 mcg total) by mouth daily before breakfast. (Patient taking differently: Take 112 mcg by mouth daily before breakfast. )  . [DISCONTINUED] minocycline (MINOCIN,DYNACIN) 50 MG capsule TAKE 1 CAPSULE BY MOUTH 2 TIMES DAILY  . [DISCONTINUED] mupirocin ointment (BACTROBAN) 2 % Apply 1 application topically 3 (three) times daily as needed. Reported on 06/19/2015  . [DISCONTINUED] triamcinolone (KENALOG) 0.025 % cream Apply topically 2 (two) times daily as needed. Reported on 06/19/2015   No facility-administered encounter medications on file as of 06/20/2016.      ONCOLOGIC FAMILY  HISTORY:  Family History  Problem Relation Age of Onset  . Diabetes Father   . Heart attack Father 63  . Diabetes Maternal Grandfather   . Cancer Maternal Grandmother        lung ca  . Cancer Paternal  Grandmother        breast ca      SOCIAL HISTORY:  Othar Curto is married and lives with his wife and kids in [place], [place].  Mr. Whalley  has 2 twin girls who are 39 years old.    Currently working full time as a Furniture conservator/restorer.  Denies any current or history of tobacco, alcohol, or illicit drug use.    PHYSICAL EXAMINATION:  Vital Signs: Vitals:   06/20/16 0843  BP: 112/83  Pulse: 76  Resp: 17  Temp: 98 F (36.7 C)   Filed Weights   06/20/16 0843  Weight: (!) 308 lb 1.6 oz (139.8 kg)   General: Well-nourished, well-appearing male in no acute distress. Unaccompanied today. HEENT: Head is normocephalic.  Pupils equal and reactive to light. Conjunctivae clear without exudate.  Sclerae anicteric. Oral mucosa is pink, moist.  Oropharynx is pink without lesions or erythema.  Lymph: No cervical, supraclavicular, infraclavicular, or axillary lymphadenopathy noted on palpation.  Cardiovascular: Regular rate and rhythm.Marland Kitchen Respiratory: Clear to auscultation bilaterally. Chest expansion symmetric; breathing non-labored.  GI: Abdomen soft and round; non-tender, non-distended. Bowel sounds normoactive. No hepatosplenomegaly.   GU: Deferred.  Neuro: No focal deficits. Steady gait.  Psych: Mood and affect normal and appropriate for situation.  Extremities: No edema. Skin: Warm and dry.   LABORATORY DATA:  Appointment on 06/20/2016  Component Date Value Ref Range Status  . WBC 06/20/2016 5.3  4.0 - 10.3 10e3/uL Final  . NEUT# 06/20/2016 2.6  1.5 - 6.5 10e3/uL Final  . HGB 06/20/2016 14.6  13.0 - 17.1 g/dL Final  . HCT 06/20/2016 43.3  38.4 - 49.9 % Final  . Platelets 06/20/2016 170  140 - 400 10e3/uL Final  . MCV 06/20/2016 87.9  79.3 - 98.0 fL Final  . MCH 06/20/2016 29.7  27.2 - 33.4 pg Final  . MCHC 06/20/2016 33.8  32.0 - 36.0 g/dL Final  . RBC 06/20/2016 4.93  4.20 - 5.82 10e6/uL Final  . RDW 06/20/2016 13.7  11.0 - 14.6 % Final  . lymph# 06/20/2016 2.0  0.9 - 3.3  10e3/uL Final  . MONO# 06/20/2016 0.5  0.1 - 0.9 10e3/uL Final  . Eosinophils Absolute 06/20/2016 0.2  0.0 - 0.5 10e3/uL Final  . Basophils Absolute 06/20/2016 0.1  0.0 - 0.1 10e3/uL Final  . NEUT% 06/20/2016 49.1  39.0 - 75.0 % Final  . LYMPH% 06/20/2016 36.7  14.0 - 49.0 % Final  . MONO% 06/20/2016 9.2  0.0 - 14.0 % Final  . EOS% 06/20/2016 3.8  0.0 - 7.0 % Final  . BASO% 06/20/2016 1.2  0.0 - 2.0 % Final  . Sodium 06/20/2016 142  136 - 145 mEq/L Final  . Potassium 06/20/2016 4.0  3.5 - 5.1 mEq/L Final  . Chloride 06/20/2016 109  98 - 109 mEq/L Final  . CO2 06/20/2016 23  22 - 29 mEq/L Final  . Glucose 06/20/2016 102  70 - 140 mg/dl Final   Glucose reference range is for nonfasting patients. Fasting glucose reference range is 70- 100.  Marland Kitchen BUN 06/20/2016 14.3  7.0 - 26.0 mg/dL Final  . Creatinine 06/20/2016 0.9  0.7 - 1.3 mg/dL Final  . Total Bilirubin 06/20/2016 1.32* 0.20 -  1.20 mg/dL Final  . Alkaline Phosphatase 06/20/2016 44  40 - 150 U/L Final  . AST 06/20/2016 35* 5 - 34 U/L Final  . ALT 06/20/2016 71* 0 - 55 U/L Final  . Total Protein 06/20/2016 6.9  6.4 - 8.3 g/dL Final  . Albumin 06/20/2016 3.9  3.5 - 5.0 g/dL Final  . Calcium 06/20/2016 8.8  8.4 - 10.4 mg/dL Final  . Anion Gap 06/20/2016 10  3 - 11 mEq/L Final  . EGFR 06/20/2016 >90  >90 ml/min/1.73 m2 Final   eGFR is calculated using the CKD-EPI Creatinine Equation (2009)    DIAGNOSTIC IMAGING:  None for this visit     ASSESSMENT AND PLAN:  Mr.. Carithers is a pleasant 41 y.o. male with history of Stage II hodgkin lymphoma  , diagnosed in 2009; treated with chemotherapy and radiation. Mr.Weingartner presents to the Survivorship Clinic for surveillance and routine follow-up.   1. History of Hodgkin Lymphoma:  Mr. Brinkmeier is currently clinically and radiographically without evidence of disease or recurrence of lymphoma. He is concerned about a fulness in his neck that hasn't always been there, thinks it may be related to weight  gain.  He has no adenopathy on exam, and he has no symptoms.  We will try to work out #2, and if anything worsens he will call me, and we can consider a CT neck.  He will follow-up in the Survivorship Clinic in 1 year with labs, history, and physical exam per surveillance protocol.  I encouraged him to call me with any questions or concerns before his next visit at the cancer center, and I would be happy to see the patient sooner, if needed.    2. Weight gain: Patient has gained about 18 pounds since last visit.  He does have h/o hypothyroidism and cannot recall his last levels or when he was seen last for this.  Currently thyroid levels are pending.  We reviewed healthy diet and exercise today.  3. Cancer screening:  Due to Mr. Bronaugh's history and age, he should receive screening for skin cancers.. The patient was encouraged to follow-up with his PCP for appropriate cancer screenings.   4. Health maintenance and wellness promotion: Mr. Monnier was encouraged to consume 5-7 servings of fruits and vegetables per day. The patient was also encouraged to engage in moderate to vigorous exercise for 30 minutes per day most days of the week. Mr. Fedak was instructed to limit his alcohol consumption and continue to abstain from tobacco use.   Dispo:  -Return to cancer center to see Survivorship NP in one year   A total of (30) minutes of face-to-face time was spent with this patient with greater than 50% of that time in counseling and care-coordination.   Gardenia Phlegm, NP Survivorship Program [place] [phone]   Note: PRIMARY CARE PROVIDER Patient, No Pcp Per None None

## 2016-06-21 ENCOUNTER — Telehealth: Payer: Self-pay | Admitting: *Deleted

## 2016-06-21 LAB — T4, FREE: FREE T4: 1.12 ng/dL (ref 0.82–1.77)

## 2016-06-21 NOTE — Telephone Encounter (Signed)
This RN called and left a message for patient to call [facility] to get his lab results. As noted by Mendel Ryder, NP, his liver enzymes are very mildly elevated. She recommends that he avoids alcohol and tylenol, and repeat CMP in four weeks. This message needs to be given to patient.

## 2016-06-21 NOTE — Telephone Encounter (Signed)
-----   Message from Gardenia Phlegm, NP sent at 06/21/2016  4:45 PM EDT ----- Patient liver enzymes are very mildly elevated.  REcommend he avoid ETOH and tylenol and repeat cmp in 4 weeks.

## 2016-06-23 ENCOUNTER — Telehealth: Payer: Self-pay | Admitting: *Deleted

## 2016-06-23 NOTE — Telephone Encounter (Signed)
Patient's wife called and stated on 06/21/16 received a message regarding patient lab results. Patient's wife asked if we could call him directly to relay Mendel Ryder, NP instructions, and to schedule the follow up lab appointment in 4 weeks. Patient cell phone number [phone].  Called patient left a voice message to call [facility] for lab results.

## 2016-06-29 ENCOUNTER — Telehealth: Payer: Self-pay | Admitting: Adult Health

## 2016-06-29 NOTE — Telephone Encounter (Signed)
error 

## 2016-06-29 NOTE — Telephone Encounter (Signed)
Confirmed 6/18 lab appt per sch msg

## 2016-07-18 ENCOUNTER — Other Ambulatory Visit: Payer: BLUE CROSS/BLUE SHIELD

## 2016-07-18 ENCOUNTER — Other Ambulatory Visit (HOSPITAL_BASED_OUTPATIENT_CLINIC_OR_DEPARTMENT_OTHER): Payer: BLUE CROSS/BLUE SHIELD

## 2016-07-18 DIAGNOSIS — E038 Other specified hypothyroidism: Secondary | ICD-10-CM

## 2016-07-18 DIAGNOSIS — Z8579 Personal history of other malignant neoplasms of lymphoid, hematopoietic and related tissues: Secondary | ICD-10-CM

## 2016-07-18 DIAGNOSIS — Z8571 Personal history of Hodgkin lymphoma: Secondary | ICD-10-CM | POA: Diagnosis not present

## 2016-07-18 LAB — CBC WITH DIFFERENTIAL/PLATELET
BASO%: 1.4 % (ref 0.0–2.0)
Basophils Absolute: 0.1 10*3/uL (ref 0.0–0.1)
EOS%: 4.8 % (ref 0.0–7.0)
Eosinophils Absolute: 0.3 10*3/uL (ref 0.0–0.5)
HEMATOCRIT: 43.9 % (ref 38.4–49.9)
HEMOGLOBIN: 14.5 g/dL (ref 13.0–17.1)
LYMPH#: 1.9 10*3/uL (ref 0.9–3.3)
LYMPH%: 34.9 % (ref 14.0–49.0)
MCH: 29.2 pg (ref 27.2–33.4)
MCHC: 33 g/dL (ref 32.0–36.0)
MCV: 88.6 fL (ref 79.3–98.0)
MONO#: 0.5 10*3/uL (ref 0.1–0.9)
MONO%: 9.1 % (ref 0.0–14.0)
NEUT%: 49.8 % (ref 39.0–75.0)
NEUTROS ABS: 2.7 10*3/uL (ref 1.5–6.5)
Platelets: 176 10*3/uL (ref 140–400)
RBC: 4.96 10*6/uL (ref 4.20–5.82)
RDW: 13.6 % (ref 11.0–14.6)
WBC: 5.4 10*3/uL (ref 4.0–10.3)

## 2016-07-18 LAB — COMPREHENSIVE METABOLIC PANEL
ALBUMIN: 3.9 g/dL (ref 3.5–5.0)
ALK PHOS: 41 U/L (ref 40–150)
ALT: 72 U/L — AB (ref 0–55)
AST: 34 U/L (ref 5–34)
Anion Gap: 8 mEq/L (ref 3–11)
BUN: 14.4 mg/dL (ref 7.0–26.0)
CALCIUM: 9 mg/dL (ref 8.4–10.4)
CO2: 24 mEq/L (ref 22–29)
CREATININE: 0.8 mg/dL (ref 0.7–1.3)
Chloride: 108 mEq/L (ref 98–109)
EGFR: >90 mL/min/{1.73_m2} (ref >90)
GLUCOSE: 96 mg/dL (ref 70–140)
Potassium: 3.8 mEq/L (ref 3.5–5.1)
Sodium: 140 mEq/L (ref 136–145)
Total Bilirubin: 1.29 mg/dL — ABNORMAL HIGH (ref 0.20–1.20)
Total Protein: 6.9 g/dL (ref 6.4–8.3)

## 2016-07-18 LAB — TSH: TSH: 2.267 m[IU]/L (ref 0.320–4.118)

## 2016-08-04 ENCOUNTER — Telehealth: Payer: Self-pay | Admitting: *Deleted

## 2016-08-04 NOTE — Telephone Encounter (Signed)
Per Mendel Ryder, NP attempt made to call patient regarding his recent lab work. Spoke with patient's wife states "husband is at work but can try to call him on his phone [phone].  Several attempts made to contact patient, and left voice messages. Patient's wife made aware to have patient to contact [facility] to discuss lab work, and understanding verbalized.

## 2016-10-21 DIAGNOSIS — E039 Hypothyroidism, unspecified: Secondary | ICD-10-CM | POA: Diagnosis not present

## 2016-10-21 DIAGNOSIS — M791 Myalgia: Secondary | ICD-10-CM | POA: Diagnosis not present

## 2016-10-21 DIAGNOSIS — E559 Vitamin D deficiency, unspecified: Secondary | ICD-10-CM | POA: Diagnosis not present

## 2016-10-21 DIAGNOSIS — R635 Abnormal weight gain: Secondary | ICD-10-CM | POA: Diagnosis not present

## 2016-12-15 DIAGNOSIS — J069 Acute upper respiratory infection, unspecified: Secondary | ICD-10-CM | POA: Diagnosis not present

## 2016-12-15 DIAGNOSIS — H6692 Otitis media, unspecified, left ear: Secondary | ICD-10-CM | POA: Diagnosis not present

## 2016-12-31 DIAGNOSIS — R05 Cough: Secondary | ICD-10-CM | POA: Diagnosis not present

## 2017-03-01 DIAGNOSIS — M545 Low back pain: Secondary | ICD-10-CM | POA: Diagnosis not present

## 2017-03-01 DIAGNOSIS — M722 Plantar fascial fibromatosis: Secondary | ICD-10-CM | POA: Diagnosis not present

## 2017-03-01 DIAGNOSIS — N529 Male erectile dysfunction, unspecified: Secondary | ICD-10-CM | POA: Diagnosis not present

## 2017-03-24 DIAGNOSIS — J069 Acute upper respiratory infection, unspecified: Secondary | ICD-10-CM | POA: Diagnosis not present

## 2017-04-01 IMAGING — CT NM PET TUM IMG RESTAG (PS) SKULL BASE T - THIGH
8 series · 25 of 25 positions shown · non-contrast
Comparison: CT chest 06/25/2014, 02/13/2012 and PET 01/27/2010.

CLINICAL DATA: Subsequent Treatment strategy for Hodgkin lymphoma.

EXAM:
NUCLEAR MEDICINE PET SKULL BASE TO THIGH
TECHNIQUE: 14.7 mCi F-18 FDG was injected intravenously. Full-ring PET imaging
was performed from the skull base to thigh after the radiotracer. CT
data was obtained and used for attenuation correction and anatomic
localization.
FASTING BLOOD GLUCOSE:  Value: 86 mg/dl

[Series 3: pet sk_thigh ac · axial · 5.0mm · 4.07mm/px · z∈[-965,+23]mm · 5 of 248 slices shown]
[im 1/248]
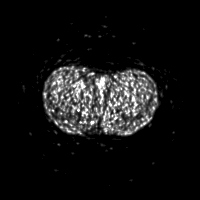
[im 62/248]
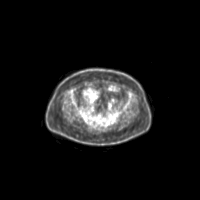
[im 124/248]
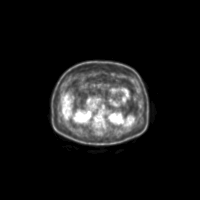
[im 186/248]
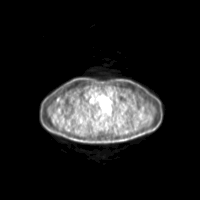
[im 248/248]
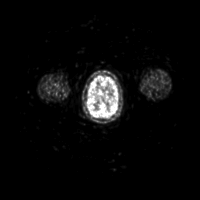

[Series 4: ct sk_thigh 5.0 b31f · axial · 5.0mm · 0.98mm/px · z∈[-965,+23]mm · 5 of 248 slices shown]
[im 1/248]
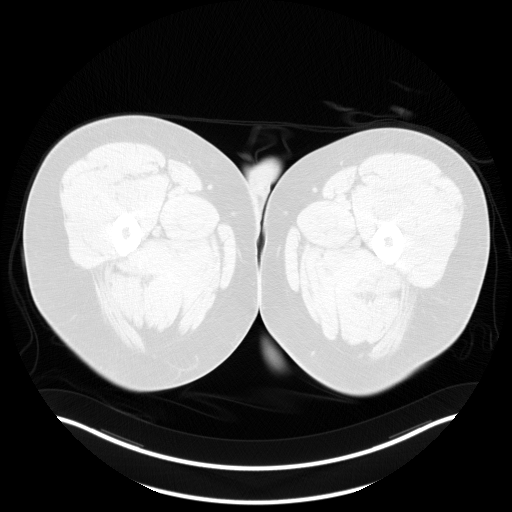
[im 62/248]
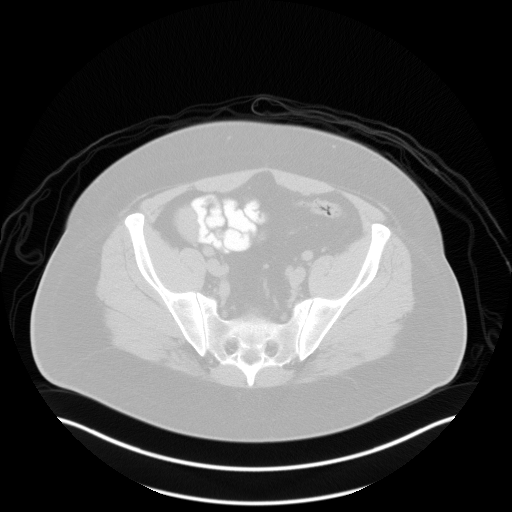
[im 124/248]
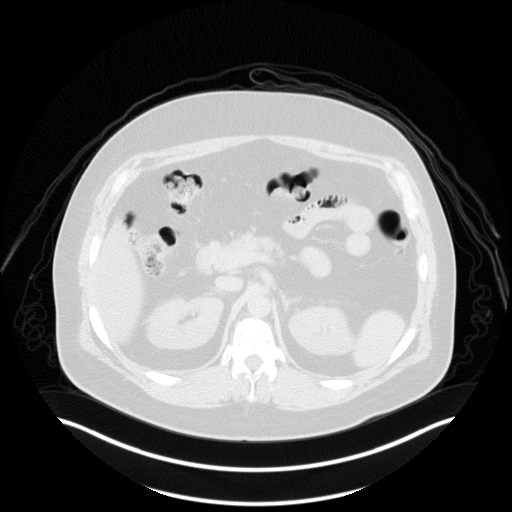
[im 186/248]
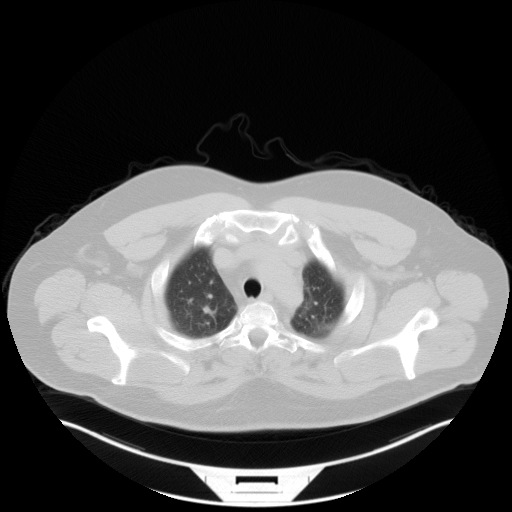
[im 248/248  brain]
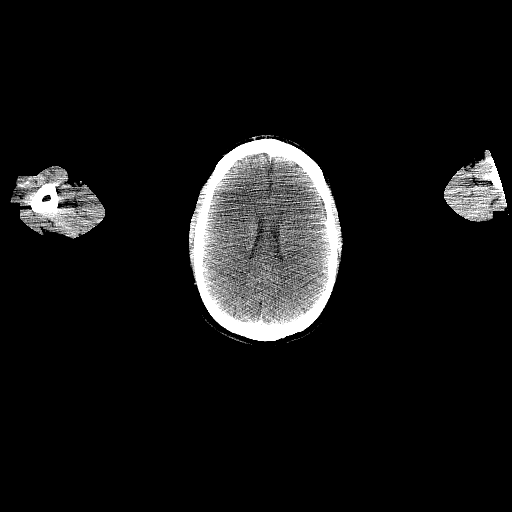

[Series 6: ct sk_thigh 5.0 b70f (id)_bone · axial · 5.0mm · 0.72mm/px · 1 of 62 slices shown]
[im 1/62  bone]
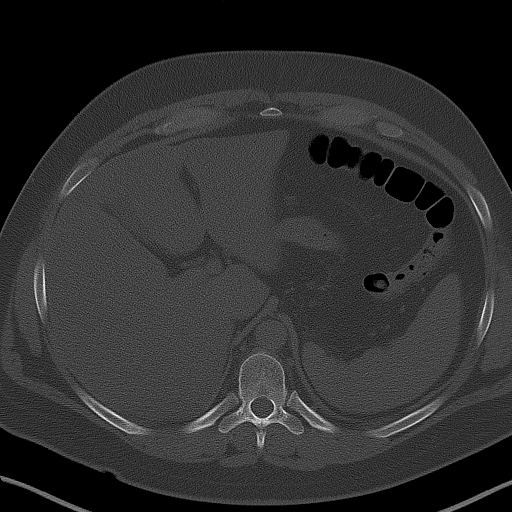

[Series 8: pet sk_thigh nac · axial · 5.0mm · 4.07mm/px · z∈[-965,+23]mm · 5 of 248 slices shown]
[im 1/248]
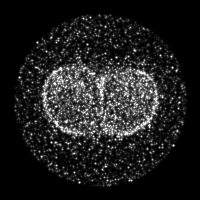
[im 62/248]
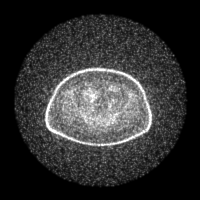
[im 124/248]
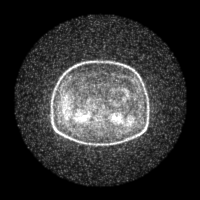
[im 186/248]
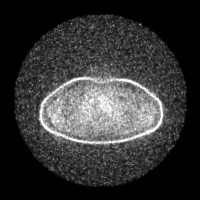
[im 248/248]
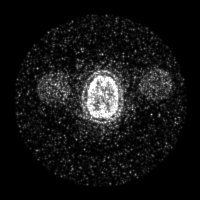

[Series 604: mip collection<mip range> · coronal · 2.05mm/px · 1 of 32 slices shown]
[im 1/32]
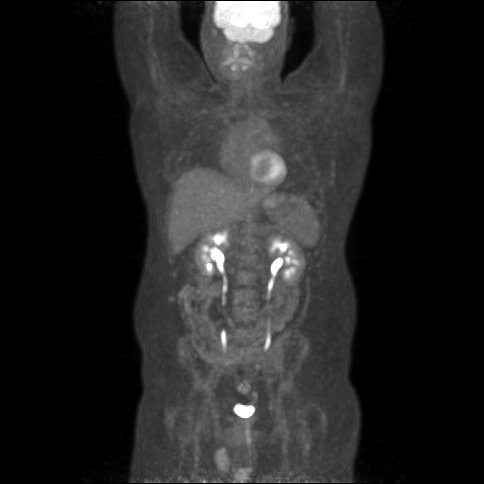

[Series 606: range-ct sk_thigh 5.0 (id)<alpha range> · 2 of 91 slices shown (1 of 2)]
[im 1/91]
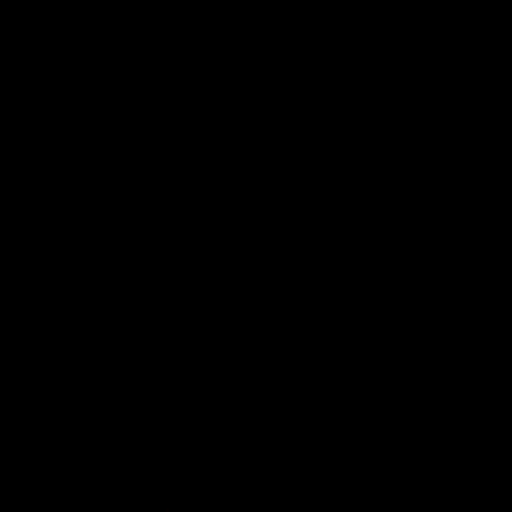
[im 91/91]
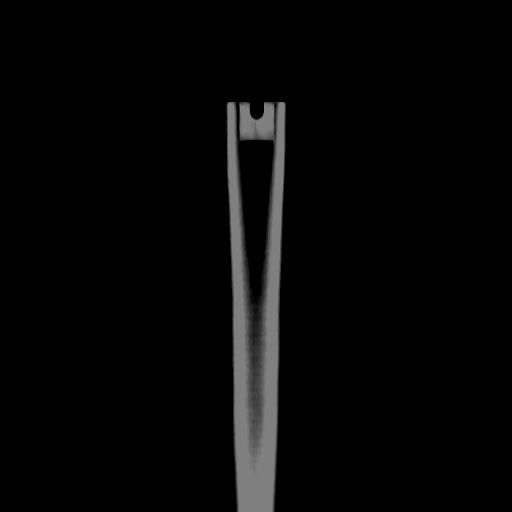

[Series 607: range-ct sk_thigh 5.0 (id)<alpha range> · 5 of 225 slices shown (2 of 2)]
[im 1/225]
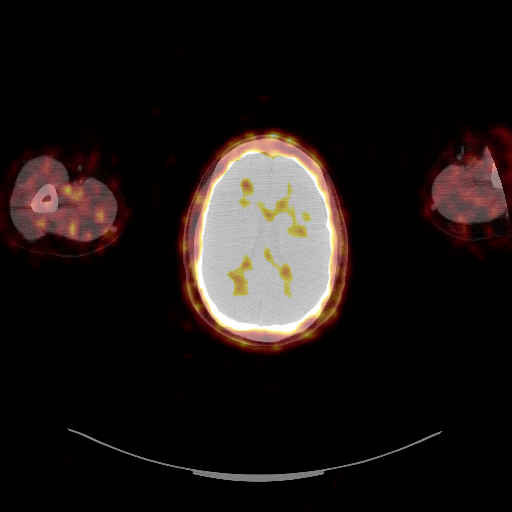
[im 57/225]
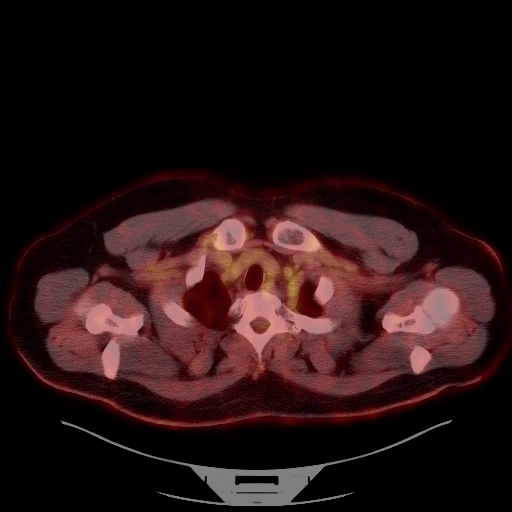
[im 113/225]
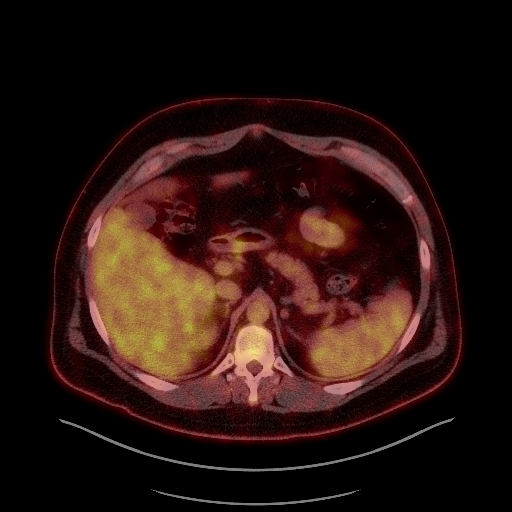
[im 169/225]
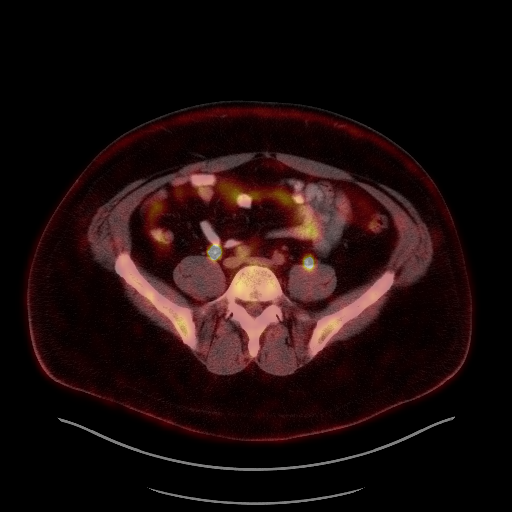
[im 225/225]
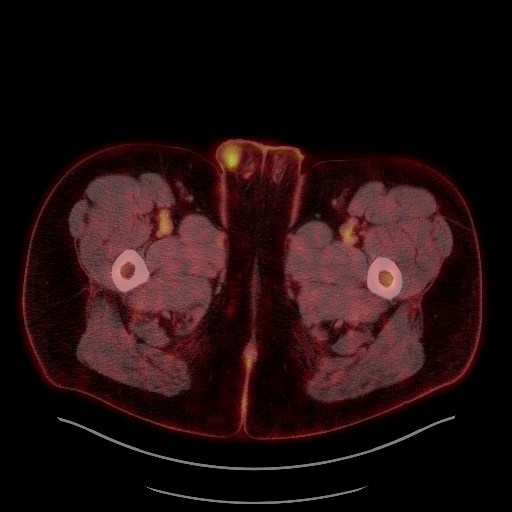

[Series 1032: results mm oncology reading · 0.89mm/px · 1 of 1 slices shown]
[im 1/1]
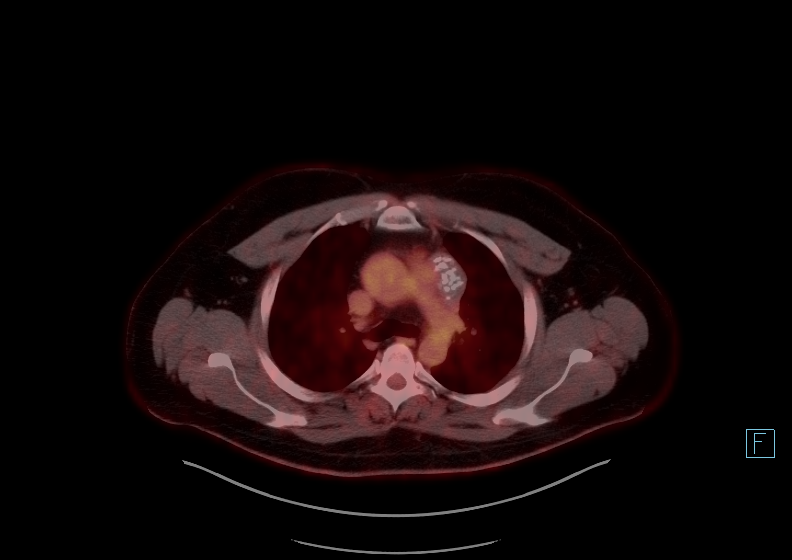

[25 of 25 positions shown; findings below may reference images not displayed]

FINDINGS: NECK

No hypermetabolic lymph nodes in the neck. CT images show no acute
findings.

CHEST

Calcified prevascular mass measures 3.0 x 5.5 cm, stable from
02/13/2012 when remeasured on that exam. No associated abnormal FDG
metabolism. No hypermetabolic mediastinal, hilar or axillary lymph
nodes. No hypermetabolic pulmonary nodules. CT images show no
pericardial or pleural effusion. Given normal respiratory motion,
lungs are grossly clear.

ABDOMEN/PELVIS

No abnormal metabolism within the liver, adrenal glands, spleen or
pancreas. No hypermetabolic lymph nodes. CT images show low
attenuation throughout the visualized portion of the liver. Liver,
gallbladder, adrenal glands, kidneys, spleen, pancreas, stomach and
bowel are otherwise grossly unremarkable. Prostate is normal in
size. No free fluid.

SKELETON

No abnormal osseous hypermetabolism.
IMPRESSION: 1. No evidence of recurrent lymphoma.
2. No abnormal hypermetabolism in the neck, chest, abdomen or
pelvis.
3. Hepatic steatosis.

## 2017-04-20 DIAGNOSIS — M791 Myalgia, unspecified site: Secondary | ICD-10-CM | POA: Diagnosis not present

## 2017-04-20 DIAGNOSIS — Z125 Encounter for screening for malignant neoplasm of prostate: Secondary | ICD-10-CM | POA: Diagnosis not present

## 2017-04-20 DIAGNOSIS — Z0000 Encounter for general adult medical examination without abnormal findings: Secondary | ICD-10-CM | POA: Diagnosis not present

## 2017-05-01 DIAGNOSIS — E039 Hypothyroidism, unspecified: Secondary | ICD-10-CM | POA: Diagnosis not present

## 2017-05-01 DIAGNOSIS — R7301 Impaired fasting glucose: Secondary | ICD-10-CM | POA: Diagnosis not present

## 2017-05-01 DIAGNOSIS — Z0000 Encounter for general adult medical examination without abnormal findings: Secondary | ICD-10-CM | POA: Diagnosis not present

## 2017-05-01 DIAGNOSIS — E559 Vitamin D deficiency, unspecified: Secondary | ICD-10-CM | POA: Diagnosis not present

## 2017-06-19 ENCOUNTER — Other Ambulatory Visit: Payer: Self-pay | Admitting: Adult Health

## 2017-06-19 DIAGNOSIS — Z8579 Personal history of other malignant neoplasms of lymphoid, hematopoietic and related tissues: Secondary | ICD-10-CM

## 2017-06-20 ENCOUNTER — Inpatient Hospital Stay: Payer: BLUE CROSS/BLUE SHIELD | Attending: Adult Health | Admitting: Adult Health

## 2017-06-20 ENCOUNTER — Inpatient Hospital Stay: Payer: BLUE CROSS/BLUE SHIELD

## 2017-06-20 ENCOUNTER — Telehealth: Payer: Self-pay | Admitting: Adult Health

## 2017-06-20 ENCOUNTER — Encounter: Payer: Self-pay | Admitting: Adult Health

## 2017-06-20 VITALS — BP 122/88 | HR 77 | Temp 98.5°F | Resp 18 | Ht 75.0 in | Wt 306.8 lb

## 2017-06-20 DIAGNOSIS — Z9221 Personal history of antineoplastic chemotherapy: Secondary | ICD-10-CM

## 2017-06-20 DIAGNOSIS — Z8571 Personal history of Hodgkin lymphoma: Secondary | ICD-10-CM | POA: Diagnosis not present

## 2017-06-20 DIAGNOSIS — Z923 Personal history of irradiation: Secondary | ICD-10-CM | POA: Diagnosis not present

## 2017-06-20 DIAGNOSIS — Z8579 Personal history of other malignant neoplasms of lymphoid, hematopoietic and related tissues: Secondary | ICD-10-CM

## 2017-06-20 DIAGNOSIS — Z8572 Personal history of non-Hodgkin lymphomas: Secondary | ICD-10-CM

## 2017-06-20 LAB — CMP (CANCER CENTER ONLY)
ALT: 66 U/L — AB (ref 0–55)
AST: 33 U/L (ref 5–34)
Albumin: 4 g/dL (ref 3.5–5.0)
Alkaline Phosphatase: 46 U/L (ref 40–150)
Anion gap: 9 (ref 3–11)
BILIRUBIN TOTAL: 1.4 mg/dL — AB (ref 0.2–1.2)
BUN: 15 mg/dL (ref 7–26)
CALCIUM: 8.7 mg/dL (ref 8.4–10.4)
CO2: 24 mmol/L (ref 22–29)
CREATININE: 0.89 mg/dL (ref 0.70–1.30)
Chloride: 107 mmol/L (ref 98–109)
GFR, Est AFR Am: >60 mL/min (ref >60)
Glucose, Bld: 109 mg/dL (ref 70–140)
Potassium: 3.6 mmol/L (ref 3.5–5.1)
Sodium: 140 mmol/L (ref 136–145)
TOTAL PROTEIN: 7.2 g/dL (ref 6.4–8.3)

## 2017-06-20 LAB — CBC WITH DIFFERENTIAL (CANCER CENTER ONLY)
BASOS ABS: 0.1 10*3/uL (ref 0.0–0.1)
Basophils Relative: 1 %
Eosinophils Absolute: 0.2 10*3/uL (ref 0.0–0.5)
Eosinophils Relative: 4 %
HCT: 42.8 % (ref 38.4–49.9)
Hemoglobin: 14.3 g/dL (ref 13.0–17.1)
LYMPHS PCT: 37 %
Lymphs Abs: 2.1 10*3/uL (ref 0.9–3.3)
MCH: 29.2 pg (ref 27.2–33.4)
MCHC: 33.3 g/dL (ref 32.0–36.0)
MCV: 87.8 fL (ref 79.3–98.0)
MONO ABS: 0.5 10*3/uL (ref 0.1–0.9)
Monocytes Relative: 9 %
Neutro Abs: 2.8 10*3/uL (ref 1.5–6.5)
Neutrophils Relative %: 49 %
Platelet Count: 179 10*3/uL (ref 140–400)
RBC: 4.88 MIL/uL (ref 4.20–5.82)
RDW: 13.6 % (ref 11.0–14.6)
WBC: 5.7 10*3/uL (ref 4.0–10.3)

## 2017-06-20 LAB — LACTATE DEHYDROGENASE: LDH: 217 U/L (ref 125–245)

## 2017-06-20 NOTE — Progress Notes (Signed)
CLINIC:  Survivorship   REASON FOR VISIT:  Routine follow-up for history of Hodgkin Lymphoma.   BRIEF ONCOLOGIC HISTORY:    INTERVAL HISTORY:  Garrett Bender presents to the Survivorship Clinic today for routine follow-up for his history of hodgkin lymphoma.  Overall, he reports feeling quite well. He continues to f/u with endocrinology about his thyroid.  He does not exercise but works in a [place] and walks frequently there.  He does have some pain in his feet that he had evaluated that is related to bone spurs.    REVIEW OF SYSTEMS:  Review of Systems  Constitutional: Negative for appetite change, chills, fatigue, fever and unexpected weight change.  HENT:   Negative for hearing loss, lump/mass and trouble swallowing.   Eyes: Negative for eye problems and icterus.  Respiratory: Negative for chest tightness, cough and shortness of breath.   Cardiovascular: Negative for chest pain, leg swelling and palpitations.  Gastrointestinal: Negative for abdominal distention, constipation, diarrhea and vomiting.  Endocrine: Negative for hot flashes.  Skin: Negative for itching and rash.  Neurological: Negative for dizziness, extremity weakness, headaches and numbness.  Hematological: Negative for adenopathy. Does not bruise/bleed easily.  Psychiatric/Behavioral: Negative for depression. The patient is not nervous/anxious.     PAST MEDICAL/SURGICAL HISTORY:  Past Medical History:  Diagnosis Date  . Acne 06/19/2015  . History of lymphoma 02/18/2011   Onset on symptoms October 2008.  Diagnosis 05/17/07.  Marland Kitchen Hodgkin's lymphoma ([place])    2009  . Thyroid disease    No past surgical history on file.   ALLERGIES:  No Known Allergies   CURRENT MEDICATIONS:  Outpatient Encounter Medications as of 06/20/2017  Medication Sig  . SYNTHROID 112 MCG tablet Take 112 mcg by mouth daily.   . [DISCONTINUED] levothyroxine (SYNTHROID) 75 MCG tablet Take 1 tablet (75 mcg total) by mouth daily before  breakfast. (Patient not taking: Reported on 06/20/2017)   No facility-administered encounter medications on file as of 06/20/2017.      ONCOLOGIC FAMILY HISTORY:  Family History  Problem Relation Age of Onset  . Diabetes Father   . Heart attack Father 74  . Diabetes Maternal Grandfather   . Cancer Maternal Grandmother        lung ca  . Cancer Paternal Grandmother        breast ca      SOCIAL HISTORY:  Social History   Socioeconomic History  . Marital status: Married    Spouse name: Not on file  . Number of children: Not on file  . Years of education: Not on file  . Highest education level: Not on file  Occupational History  . Not on file  Social Needs  . Financial resource strain: Not on file  . Food insecurity:    Worry: Not on file    Inability: Not on file  . Transportation needs:    Medical: Not on file    Non-medical: Not on file  Tobacco Use  . Smoking status: Never Smoker  . Smokeless tobacco: Never Used  Substance and Sexual Activity  . Alcohol use: No  . Drug use: No  . Sexual activity: Not on file  Lifestyle  . Physical activity:    Days per week: Not on file    Minutes per session: Not on file  . Stress: Not on file  Relationships  . Social connections:    Talks on phone: Not on file    Gets together: Not on file  Attends religious service: Not on file    Active member of club or organization: Not on file    Attends meetings of clubs or organizations: Not on file    Relationship status: Not on file  . Intimate partner violence:    Fear of current or ex partner: Not on file    Emotionally abused: Not on file    Physically abused: Not on file    Forced sexual activity: Not on file  Other Topics Concern  . Not on file  Social History Narrative  . Not on file       PHYSICAL EXAMINATION:  Vital Signs: Vitals:   06/20/17 1013  BP: 122/88  Pulse: 77  Resp: 18  Temp: 98.5 F (36.9 C)  SpO2: 99%   Filed Weights   06/20/17 1013    Weight: (!) 306 lb 12.8 oz (139.2 kg)   General: Well-nourished, well-appearing male in no acute distress. Unaccompanied today. HEENT: Head is normocephalic.  Pupils equal and reactive to light. Conjunctivae clear without exudate.  Sclerae anicteric. Oral mucosa is pink, moist.  Oropharynx is pink without lesions or erythema.  Lymph: No cervical, supraclavicular, infraclavicular, or axillary lymphadenopathy noted on palpation.  Cardiovascular: Regular rate and rhythm.Marland Kitchen Respiratory: Clear to auscultation bilaterally. Chest expansion symmetric; breathing non-labored.  GI: Abdomen soft and round; non-tender, non-distended. Bowel sounds normoactive. No hepatosplenomegaly.   GU: Deferred.  Neuro: No focal deficits. Steady gait.  Psych: Mood and affect normal and appropriate for situation.  Extremities: No edema. Skin: Warm and dry.   LABORATORY DATA:  Appointment on 06/20/2017  Component Date Value Ref Range Status  . WBC Count 06/20/2017 5.7  4.0 - 10.3 K/uL Final  . RBC 06/20/2017 4.88  4.20 - 5.82 MIL/uL Final  . Hemoglobin 06/20/2017 14.3  13.0 - 17.1 g/dL Final  . HCT 06/20/2017 42.8  38.4 - 49.9 % Final  . MCV 06/20/2017 87.8  79.3 - 98.0 fL Final  . MCH 06/20/2017 29.2  27.2 - 33.4 pg Final  . MCHC 06/20/2017 33.3  32.0 - 36.0 g/dL Final  . RDW 06/20/2017 13.6  11.0 - 14.6 % Final  . Platelet Count 06/20/2017 179  140 - 400 K/uL Final  . Neutrophils Relative % 06/20/2017 49  % Final  . Neutro Abs 06/20/2017 2.8  1.5 - 6.5 K/uL Final  . Lymphocytes Relative 06/20/2017 37  % Final  . Lymphs Abs 06/20/2017 2.1  0.9 - 3.3 K/uL Final  . Monocytes Relative 06/20/2017 9  % Final  . Monocytes Absolute 06/20/2017 0.5  0.1 - 0.9 K/uL Final  . Eosinophils Relative 06/20/2017 4  % Final  . Eosinophils Absolute 06/20/2017 0.2  0.0 - 0.5 K/uL Final  . Basophils Relative 06/20/2017 1  % Final  . Basophils Absolute 06/20/2017 0.1  0.0 - 0.1 K/uL Final   Performed at [facility], [place] [address] [place], [place] [address]  . Sodium 06/20/2017 140  136 - 145 mmol/L Final  . Potassium 06/20/2017 3.6  3.5 - 5.1 mmol/L Final  . Chloride 06/20/2017 107  98 - 109 mmol/L Final  . CO2 06/20/2017 24  22 - 29 mmol/L Final  . Glucose, Bld 06/20/2017 109  70 - 140 mg/dL Final  . BUN 06/20/2017 15  7 - 26 mg/dL Final  . Creatinine 06/20/2017 0.89  0.70 - 1.30 mg/dL Final  . Calcium 06/20/2017 8.7  8.4 - 10.4 mg/dL Final  . Total Protein 06/20/2017 7.2  6.4 -  8.3 g/dL Final  . Albumin 06/20/2017 4.0  3.5 - 5.0 g/dL Final  . AST 06/20/2017 33  5 - 34 U/L Final  . ALT 06/20/2017 66* 0 - 55 U/L Final  . Alkaline Phosphatase 06/20/2017 46  40 - 150 U/L Final  . Total Bilirubin 06/20/2017 1.4* 0.2 - 1.2 mg/dL Final  . GFR, Est Non Af Am 06/20/2017 >60  >60 mL/min Final  . GFR, Est AFR Am 06/20/2017 >60  >60 mL/min Final   Comment: (NOTE) The eGFR has been calculated using the CKD EPI equation. This calculation has not been validated in all clinical situations. eGFR's persistently <60 mL/min signify possible Chronic Kidney Disease.   Georgiann Hahn gap 06/20/2017 9  3 - 11 Final   Performed at [facility], [place] [address], [place], [place] [address]  . LDH 06/20/2017 217  125 - 245 U/L Final   Performed at [facility], [place] [address], [place], [place] [address]    DIAGNOSTIC IMAGING:  None for this visit  RECOMMENDATIONS:        ASSESSMENT AND PLAN:  Garrett Bender is a pleasant 42 y.o. male with history of Hodgkin Lymphoma diagnosed in 2009, treated with chemotherapy and radiation. Garrett Bender presents to the Survivorship Clinic for surveillance and routine follow-up.   1. History of lymphoma:  Garrett Bender is currently clinically and radiographically without evidence of disease or recurrence of lymphoma. he will follow-up in the Survivorship Clinic in 1 year with labs, history, and physical exam per surveillance protocol.   We reviewed the possibility of him following up with his PCP for his surveillance from now on.  He cannot recall his PCP's name.  If he would like to see PCP for follow up, he will call us with their information and I will fax him my note.   He does have thyroid issues that are managed by endocrinology.  I encouraged him to call me with any questions or concerns before his next visit at the cancer center, and I would be happy to see the patient sooner, if needed.    2. Cancer screening:  Due to Garrett. Bender's history and age, he should receive screening for skin cancers, colon cancer in 6 years. The patient was encouraged to follow-up with his PCP for appropriate cancer screenings.   3. Health maintenance and wellness promotion: Garrett Bender was encouraged to consume 5-7 servings of fruits and vegetables per day. The patient was also encouraged to engage in moderate to vigorous exercise for 30 minutes per day most days of the week. Garrett Bender was instructed to limit her alcohol consumption and continue to abstain from tobacco    Dispo:  -Return to cancer center to see Survivorship NP in one year with labs prior    A total of (30) minutes of face-to-face time was spent with this patient with greater than 50% of that time in counseling and care-coordination.   Garrett Bihari, NP Survivorship Program [place] [phone]   Note: PRIMARY CARE PROVIDER Patient, No Pcp Per None None

## 2017-06-20 NOTE — Telephone Encounter (Signed)
Gave avs and calendar ° °

## 2017-09-05 DIAGNOSIS — J019 Acute sinusitis, unspecified: Secondary | ICD-10-CM | POA: Diagnosis not present

## 2017-10-20 DIAGNOSIS — E559 Vitamin D deficiency, unspecified: Secondary | ICD-10-CM | POA: Diagnosis not present

## 2017-10-20 DIAGNOSIS — E039 Hypothyroidism, unspecified: Secondary | ICD-10-CM | POA: Diagnosis not present

## 2017-10-20 DIAGNOSIS — Z23 Encounter for immunization: Secondary | ICD-10-CM | POA: Diagnosis not present

## 2017-10-20 DIAGNOSIS — R7301 Impaired fasting glucose: Secondary | ICD-10-CM | POA: Diagnosis not present

## 2017-12-14 DIAGNOSIS — H5213 Myopia, bilateral: Secondary | ICD-10-CM | POA: Diagnosis not present

## 2018-01-27 DIAGNOSIS — B084 Enteroviral vesicular stomatitis with exanthem: Secondary | ICD-10-CM | POA: Diagnosis not present

## 2018-01-27 DIAGNOSIS — R21 Rash and other nonspecific skin eruption: Secondary | ICD-10-CM | POA: Diagnosis not present

## 2018-05-21 DIAGNOSIS — Z125 Encounter for screening for malignant neoplasm of prostate: Secondary | ICD-10-CM | POA: Diagnosis not present

## 2018-05-21 DIAGNOSIS — Z0000 Encounter for general adult medical examination without abnormal findings: Secondary | ICD-10-CM | POA: Diagnosis not present

## 2018-05-21 DIAGNOSIS — E559 Vitamin D deficiency, unspecified: Secondary | ICD-10-CM | POA: Diagnosis not present

## 2018-05-21 DIAGNOSIS — E039 Hypothyroidism, unspecified: Secondary | ICD-10-CM | POA: Diagnosis not present

## 2018-05-22 ENCOUNTER — Telehealth: Payer: Self-pay | Admitting: *Deleted

## 2018-05-22 NOTE — Telephone Encounter (Signed)
Called and spoke with the patient regarding his appt in May. Per 4/20 los appt on 5/26 has been rescheduled to 5/22 and changed to a WebEx appt

## 2018-05-28 DIAGNOSIS — Z1212 Encounter for screening for malignant neoplasm of rectum: Secondary | ICD-10-CM | POA: Diagnosis not present

## 2018-05-28 DIAGNOSIS — Z0000 Encounter for general adult medical examination without abnormal findings: Secondary | ICD-10-CM | POA: Diagnosis not present

## 2018-05-28 DIAGNOSIS — E559 Vitamin D deficiency, unspecified: Secondary | ICD-10-CM | POA: Diagnosis not present

## 2018-05-28 DIAGNOSIS — K644 Residual hemorrhoidal skin tags: Secondary | ICD-10-CM | POA: Diagnosis not present

## 2018-05-28 DIAGNOSIS — R7301 Impaired fasting glucose: Secondary | ICD-10-CM | POA: Diagnosis not present

## 2018-05-28 DIAGNOSIS — E669 Obesity, unspecified: Secondary | ICD-10-CM | POA: Diagnosis not present

## 2018-05-28 DIAGNOSIS — E039 Hypothyroidism, unspecified: Secondary | ICD-10-CM | POA: Diagnosis not present

## 2018-05-28 DIAGNOSIS — R9431 Abnormal electrocardiogram [ECG] [EKG]: Secondary | ICD-10-CM | POA: Diagnosis not present

## 2018-05-29 ENCOUNTER — Telehealth: Payer: Self-pay | Admitting: Cardiology

## 2018-05-29 NOTE — Telephone Encounter (Signed)
05-29-18/3:09pm. Left Pt detailed vm asking for a return call [id number] GXT./rval

## 2018-05-29 NOTE — Telephone Encounter (Signed)
05-29-18/3:45pm. Pt returned my call. He is aware of scheduled GXT on 06-26-18. Arrival time 9:30am/test time 10:00am. Office address given to Pt. Pt informed to wear comfortable clothing. JA:[id number] 19 questions [place]en out of the [place]NO Temperature/Fever/NO Contact with Diagnosed COVID 19 person/NO  Cough/NO Weakness,Nausea,SOB/NO Sore throat/NO Appt calendar with appt date time and location mailed to Pt./rval

## 2018-06-20 ENCOUNTER — Other Ambulatory Visit: Payer: BLUE CROSS/BLUE SHIELD

## 2018-06-20 ENCOUNTER — Telehealth (HOSPITAL_COMMUNITY): Payer: Self-pay | Admitting: *Deleted

## 2018-06-20 ENCOUNTER — Telehealth: Payer: Self-pay | Admitting: Adult Health

## 2018-06-20 NOTE — Telephone Encounter (Signed)
Called patient regarding upcoming Webex appointment, patient would prefer this to be a telephone visit.

## 2018-06-21 ENCOUNTER — Telehealth (HOSPITAL_COMMUNITY): Payer: Self-pay | Admitting: *Deleted

## 2018-06-21 ENCOUNTER — Telehealth: Payer: Self-pay | Admitting: Adult Health

## 2018-06-21 NOTE — Telephone Encounter (Signed)
I called and left msg regarding phone visit appt and pre reg.

## 2018-06-21 NOTE — Telephone Encounter (Signed)
Testing is completed.   Encounter complete

## 2018-06-22 ENCOUNTER — Inpatient Hospital Stay: Payer: BLUE CROSS/BLUE SHIELD | Attending: Adult Health | Admitting: Adult Health

## 2018-06-22 DIAGNOSIS — E039 Hypothyroidism, unspecified: Secondary | ICD-10-CM

## 2018-06-22 DIAGNOSIS — Z79899 Other long term (current) drug therapy: Secondary | ICD-10-CM | POA: Diagnosis not present

## 2018-06-22 DIAGNOSIS — Z8579 Personal history of other malignant neoplasms of lymphoid, hematopoietic and related tissues: Secondary | ICD-10-CM | POA: Diagnosis not present

## 2018-06-22 NOTE — Progress Notes (Signed)
CLINIC:  Survivorship virtual visit  I connected with Garrett Bender on 06/22/2018 at 31 am by telephone and verified that I am speaking with the correct person using two identifiers.   I discussed the limitations, risks, security and privacy concerns of performing an evaluation and management service by telephone and the availability of in person appointments. I also discussed with the patient that there may be a patient responsible charge related to this service. The patient expressed understanding and agreed to proceed.    REASON FOR VISIT:  Routine follow-up for history of Hodgkin Lymphoma.   BRIEF ONCOLOGIC HISTORY:  Per Dr. Calton Dach last note: This patient was diagnosed with Hodgkin's lymphoma stage II with a very large mediastinal mass, cervical and supraclavicular lymphadenopathy involving the left side of the neck diagnosed by a biopsy of a left supraclavicular lymph node on 05/17/2007. His symptoms consisted of dyspnea, profound night sweats and noisy breathing .The patient's symptoms started in [place] of 2008 but he did not seek medical attention because he did not have medical insurance. The patient had a large left pleural effusion. He received 6-1/2 cycles of ABVD chemotherapy with an excellent response. Chemotherapy was given from 05/25/2007 through 11/09/2007. He then received radiation treatments 3960 centigray from 11/26/2007 through 12/25/2007. He has remained in complete clinical remission since then.  His treatment was complicated by peripheral neuropathy which have subsequently resolved. He also developed chronic hypothyroidism and is on chronic supplement therapy.   INTERVAL HISTORY:  Garrett Bender is doing moderately well.  He continues to follow with endocrinology about his hypothyroidism and his pcp annually, last seen one month ago.  Recently he has been referred to cardiology.  He was recommended to undergo a stress test to evaluate his heart due to questionable  nerve damage he experienced from chemotherapy.  He denies any new symptoms such as fever, chills, night sweats, unintentional weight loss, lymphandenopathy.  He is practicing social distancing due to [place] 19.    REVIEW OF SYSTEMS:  Review of Systems  Constitutional: Negative for appetite change, chills, fatigue, fever and unexpected weight change.  HENT:   Negative for hearing loss, lump/mass and trouble swallowing.   Eyes: Negative for eye problems and icterus.  Respiratory: Negative for chest tightness, cough and shortness of breath.   Cardiovascular: Negative for chest pain, leg swelling and palpitations.  Gastrointestinal: Negative for abdominal distention, constipation, diarrhea and vomiting.  Endocrine: Negative for hot flashes.  Skin: Negative for itching and rash.  Neurological: Negative for dizziness, extremity weakness, headaches and numbness.  Hematological: Negative for adenopathy. Does not bruise/bleed easily.  Psychiatric/Behavioral: Negative for depression. The patient is not nervous/anxious.     PAST MEDICAL/SURGICAL HISTORY:  Past Medical History:  Diagnosis Date  . Acne 06/19/2015  . History of lymphoma 02/18/2011   Onset on symptoms October 2008.  Diagnosis 05/17/07.  Marland Kitchen Hodgkin's lymphoma ([place])    2009  . Thyroid disease    No past surgical history on file.   ALLERGIES:  No Known Allergies   CURRENT MEDICATIONS:  Outpatient Encounter Medications as of 06/22/2018  Medication Sig  . SYNTHROID 112 MCG tablet Take 112 mcg by mouth daily.    No facility-administered encounter medications on file as of 06/22/2018.      ONCOLOGIC FAMILY HISTORY:  Family History  Problem Relation Age of Onset  . Diabetes Father   . Heart attack Father 31  . Diabetes Maternal Grandfather   . Cancer Maternal Grandmother  lung ca  . Cancer Paternal Grandmother        breast ca      SOCIAL HISTORY:  Social History   Socioeconomic History  . Marital status: Married     Spouse name: Not on file  . Number of children: Not on file  . Years of education: Not on file  . Highest education level: Not on file  Occupational History  . Not on file  Social Needs  . Financial resource strain: Not on file  . Food insecurity:    Worry: Not on file    Inability: Not on file  . Transportation needs:    Medical: Not on file    Non-medical: Not on file  Tobacco Use  . Smoking status: Never Smoker  . Smokeless tobacco: Never Used  Substance and Sexual Activity  . Alcohol use: No  . Drug use: No  . Sexual activity: Not on file  Lifestyle  . Physical activity:    Days per week: Not on file    Minutes per session: Not on file  . Stress: Not on file  Relationships  . Social connections:    Talks on phone: Not on file    Gets together: Not on file    Attends religious service: Not on file    Active member of club or organization: Not on file    Attends meetings of clubs or organizations: Not on file    Relationship status: Not on file  . Intimate partner violence:    Fear of current or ex partner: Not on file    Emotionally abused: Not on file    Physically abused: Not on file    Forced sexual activity: Not on file  Other Topics Concern  . Not on file  Social History Narrative  . Not on file      OBJECTIVE:  Patient is in no apparent distress.  Speech is non pressured, and breathing non labored.  His mood and behavior are normal.    LABORATORY DATA:  None at this time  DIAGNOSTIC IMAGING:  None for this visit  RECOMMENDATIONS:        ASSESSMENT AND PLAN:  Garrett Bender is a pleasant 43 y.o. male with history of Hodgkin Lymphoma diagnosed in 2009, treated with chemotherapy and radiation. [name] presents to the Survivorship Clinic for surveillance and routine follow-up.   1. History of lymphoma:  Garrett Bender is currently clinically without evidence of disease or recurrence of lymphoma. he will follow-up in the Survivorship Clinic in 1 year  with labs, history, and physical exam per surveillance protocol. He did not have lab work done with this visit due to the fact that he wants to avoid all appointments due to [place]. I will reach out to his PCP to get his labs from them.  He does need LDH which likely was not drawn, but that can always be drawn in a few months, once we have more direction on covid 19.  I reviewed the opportunity of him following with his PCP from this point forward, however he would like to return to see [place] in one year which we are happy to accommodate.  I encouraged him to call me with any questions or concerns before his next visit at the cancer center, and I would be happy to see the patient sooner, if needed.    2. Cancer screening:  Due to Garrett Bender's history and age, he should receive screening for skin cancers, colon cancer in  6 years. The patient was encouraged to follow-up with his PCP for appropriate cancer screenings.   3. Health maintenance and wellness promotion: Garrett Bender was encouraged to consume 5-7 servings of fruits and vegetables per day. The patient was also encouraged to engage in moderate to vigorous exercise for 30 minutes per day most days of the week. Garrett Bender was instructed to limit her alcohol consumption and continue to abstain from tobacco   Follow Up Instructions:  -Return to cancer center to see Survivorship NP in one year with labs prior   I discussed the assessment and treatment plan with the patient. The patient was provided an opportunity to ask questions and all were answered. The patient agreed with the plan and demonstrated an understanding of the instructions.   The patient was advised to call back or seek an in-person evaluation if the symptoms worsen or if the condition fails to improve as anticipated.  I provided 16 minutes of non face-to-face telephone visit time during this encounter, and > 50% was spent counseling as documented under my assessment & plan.   Wilber Bihari, NP Survivorship Program [place] [phone]   Note: PRIMARY CARE PROVIDER [place], [place] [phone]

## 2018-06-26 ENCOUNTER — Encounter: Payer: BLUE CROSS/BLUE SHIELD | Admitting: Adult Health

## 2018-06-26 ENCOUNTER — Other Ambulatory Visit: Payer: BLUE CROSS/BLUE SHIELD

## 2018-06-26 ENCOUNTER — Encounter: Payer: Self-pay | Admitting: Adult Health

## 2018-10-19 DIAGNOSIS — E039 Hypothyroidism, unspecified: Secondary | ICD-10-CM | POA: Diagnosis not present

## 2018-10-19 DIAGNOSIS — R7301 Impaired fasting glucose: Secondary | ICD-10-CM | POA: Diagnosis not present

## 2019-01-10 ENCOUNTER — Other Ambulatory Visit: Payer: Self-pay

## 2019-01-10 DIAGNOSIS — Z20822 Contact with and (suspected) exposure to covid-19: Secondary | ICD-10-CM

## 2019-01-12 LAB — NOVEL CORONAVIRUS, NAA: SARS-CoV-2, NAA: NOT DETECTED

## 2019-01-23 NOTE — Telephone Encounter (Signed)
Made in error

## 2019-06-24 ENCOUNTER — Other Ambulatory Visit: Payer: BLUE CROSS/BLUE SHIELD

## 2019-06-24 ENCOUNTER — Encounter: Payer: BLUE CROSS/BLUE SHIELD | Admitting: Adult Health

## 2019-10-09 DIAGNOSIS — Z8571 Personal history of Hodgkin lymphoma: Secondary | ICD-10-CM | POA: Diagnosis not present

## 2019-10-09 DIAGNOSIS — E669 Obesity, unspecified: Secondary | ICD-10-CM | POA: Diagnosis not present

## 2019-10-09 DIAGNOSIS — M546 Pain in thoracic spine: Secondary | ICD-10-CM | POA: Diagnosis not present

## 2019-10-09 DIAGNOSIS — E039 Hypothyroidism, unspecified: Secondary | ICD-10-CM | POA: Diagnosis not present

## 2019-10-11 DIAGNOSIS — E559 Vitamin D deficiency, unspecified: Secondary | ICD-10-CM | POA: Diagnosis not present

## 2019-10-11 DIAGNOSIS — Z23 Encounter for immunization: Secondary | ICD-10-CM | POA: Diagnosis not present

## 2019-10-11 DIAGNOSIS — R7301 Impaired fasting glucose: Secondary | ICD-10-CM | POA: Diagnosis not present

## 2019-10-11 DIAGNOSIS — E039 Hypothyroidism, unspecified: Secondary | ICD-10-CM | POA: Diagnosis not present

## 2019-11-03 DIAGNOSIS — Z20822 Contact with and (suspected) exposure to covid-19: Secondary | ICD-10-CM | POA: Diagnosis not present

## 2019-11-04 ENCOUNTER — Telehealth: Payer: Self-pay | Admitting: Nurse Practitioner

## 2019-11-04 ENCOUNTER — Other Ambulatory Visit: Payer: Self-pay | Admitting: Nurse Practitioner

## 2019-11-04 ENCOUNTER — Encounter: Payer: Self-pay | Admitting: Nurse Practitioner

## 2019-11-04 DIAGNOSIS — U071 COVID-19: Secondary | ICD-10-CM

## 2019-11-04 NOTE — Telephone Encounter (Signed)
Called to Discuss with patient about Covid symptoms and the use of regeneron, a monoclonal antibody infusion for those with mild to moderate Covid symptoms and at a high risk of hospitalization.     Pt is qualified for this infusion at the  infusion center due to co-morbid conditions and/or a member of an at-risk group.     Unable to reach pt. Left message to return call. Sent mychart message.   Eisa Conaway, DNP, AGNP-C [phone] (Infusion Center Hotline)  

## 2019-11-04 NOTE — Progress Notes (Signed)
I connected by phone with Garrett Bender on 11/04/2019 at 2:13 PM to discuss the potential use of an new treatment for mild to moderate COVID-19 viral infection in non-hospitalized patients.  This patient is a 44 y.o. male that meets the FDA criteria for Emergency Use Authorization of casirivimab\imdevimab.  Has a (+) direct SARS-CoV-2 viral test result  Has mild or moderate COVID-19   Is ? 42 years of age and weighs ? 40 kg  Is NOT hospitalized due to COVID-19  Is NOT requiring oxygen therapy or requiring an increase in baseline oxygen flow rate due to COVID-19  Is within 10 days of symptom onset  Has at least one of the high risk factor(s) for progression to severe COVID-19 and/or hospitalization as defined in EUA.  Specific high risk criteria : BMI > 25  Onset 10/1. Vaccinated.   I have spoken and communicated the following to the patient or parent/caregiver:  1. FDA has authorized the emergency use of casirivimab\imdevimab for the treatment of mild to moderate COVID-19 in adults and pediatric patients with positive results of direct SARS-CoV-2 viral testing who are 26 years of age and older weighing at least 40 kg, and who are at high risk for progressing to severe COVID-19 and/or hospitalization.  2. The significant known and potential risks and benefits of casirivimab\imdevimab, and the extent to which such potential risks and benefits are unknown.  3. Information on available alternative treatments and the risks and benefits of those alternatives, including clinical trials.  4. Patients treated with casirivimab\imdevimab should continue to self-isolate and use infection control measures (e.g., wear mask, isolate, social distance, avoid sharing personal items, clean and disinfect "high touch" surfaces, and frequent handwashing) according to CDC guidelines.   5. The patient or parent/caregiver has the option to accept or refuse casirivimab\imdevimab .  After reviewing this  information with the patient, the patient has agreed to receive one of the available covid 19 monoclonal antibodies and will be provided an appropriate fact sheet prior to infusion.[name], [place], AGNP-C [phone] ([place])

## 2019-11-05 ENCOUNTER — Ambulatory Visit (HOSPITAL_COMMUNITY)
Admission: RE | Admit: 2019-11-05 | Discharge: 2019-11-05 | Disposition: A | Discharge status: Home / Self Care | Payer: BC Managed Care – PPO | Source: Ambulatory Visit | Attending: Pulmonary Disease | Admitting: Pulmonary Disease

## 2019-11-05 DIAGNOSIS — U071 COVID-19: Secondary | ICD-10-CM | POA: Diagnosis not present

## 2019-11-05 MED ORDER — SODIUM CHLORIDE 0.9 % IV SOLN: INTRAVENOUS | Status: DC | PRN

## 2019-11-05 MED ORDER — DIPHENHYDRAMINE HCL 50 MG/ML IJ SOLN: 50.0000 mg | Freq: Once | INTRAMUSCULAR | Status: DC | PRN

## 2019-11-05 MED ORDER — FAMOTIDINE IN NACL 20-0.9 MG/50ML-% IV SOLN: 20.0000 mg | Freq: Once | INTRAVENOUS | Status: DC | PRN

## 2019-11-05 MED ORDER — EPINEPHRINE 0.3 MG/0.3ML IJ SOAJ: 0.3000 mg | Freq: Once | INTRAMUSCULAR | Status: DC | PRN

## 2019-11-05 MED ORDER — SODIUM CHLORIDE 0.9 % IV SOLN
1200.0000 mg | Freq: Once | INTRAVENOUS | Status: AC
  Administered 2019-11-05: 1200 mg via INTRAVENOUS

## 2019-11-05 MED ORDER — METHYLPREDNISOLONE SODIUM SUCC 125 MG IJ SOLR: 125.0000 mg | Freq: Once | INTRAMUSCULAR | Status: DC | PRN

## 2019-11-05 MED ORDER — ALBUTEROL SULFATE HFA 108 (90 BASE) MCG/ACT IN AERS: 2.0000 | INHALATION_SPRAY | Freq: Once | RESPIRATORY_TRACT | Status: DC | PRN

## 2019-11-05 NOTE — Progress Notes (Signed)
  Diagnosis: COVID-19  Physician:dr wright patrick   Procedure: Covid Infusion Clinic Med: casirivimab\imdevimab infusion - Provided patient with casirivimab\imdevimab fact sheet for patients, parents and caregivers prior to infusion.  Complications: No immediate complications noted.  Discharge: Discharged home   Heide Scales 11/05/2019

## 2019-11-05 NOTE — Discharge Instructions (Signed)

## 2020-02-24 DIAGNOSIS — H6983 Other specified disorders of Eustachian tube, bilateral: Secondary | ICD-10-CM | POA: Diagnosis not present

## 2020-02-25 DIAGNOSIS — E039 Hypothyroidism, unspecified: Secondary | ICD-10-CM | POA: Diagnosis not present

## 2020-02-25 DIAGNOSIS — R7301 Impaired fasting glucose: Secondary | ICD-10-CM | POA: Diagnosis not present

## 2020-02-25 DIAGNOSIS — Z0000 Encounter for general adult medical examination without abnormal findings: Secondary | ICD-10-CM | POA: Diagnosis not present

## 2020-02-25 DIAGNOSIS — Z125 Encounter for screening for malignant neoplasm of prostate: Secondary | ICD-10-CM | POA: Diagnosis not present

## 2020-02-28 DIAGNOSIS — E039 Hypothyroidism, unspecified: Secondary | ICD-10-CM | POA: Diagnosis not present

## 2020-02-28 DIAGNOSIS — Z0000 Encounter for general adult medical examination without abnormal findings: Secondary | ICD-10-CM | POA: Diagnosis not present

## 2020-02-28 DIAGNOSIS — R7301 Impaired fasting glucose: Secondary | ICD-10-CM | POA: Diagnosis not present

## 2020-07-29 DIAGNOSIS — T1502XA Foreign body in cornea, left eye, initial encounter: Secondary | ICD-10-CM | POA: Diagnosis not present

## 2020-07-30 DIAGNOSIS — T1502XD Foreign body in cornea, left eye, subsequent encounter: Secondary | ICD-10-CM | POA: Diagnosis not present

## 2020-08-04 DIAGNOSIS — T1502XD Foreign body in cornea, left eye, subsequent encounter: Secondary | ICD-10-CM | POA: Diagnosis not present

## 2020-10-09 DIAGNOSIS — E559 Vitamin D deficiency, unspecified: Secondary | ICD-10-CM | POA: Diagnosis not present

## 2020-10-09 DIAGNOSIS — Z23 Encounter for immunization: Secondary | ICD-10-CM | POA: Diagnosis not present

## 2020-10-09 DIAGNOSIS — R7301 Impaired fasting glucose: Secondary | ICD-10-CM | POA: Diagnosis not present

## 2020-10-09 DIAGNOSIS — E039 Hypothyroidism, unspecified: Secondary | ICD-10-CM | POA: Diagnosis not present

## 2021-03-05 DIAGNOSIS — Z125 Encounter for screening for malignant neoplasm of prostate: Secondary | ICD-10-CM | POA: Diagnosis not present

## 2021-03-05 DIAGNOSIS — Z0000 Encounter for general adult medical examination without abnormal findings: Secondary | ICD-10-CM | POA: Diagnosis not present

## 2021-03-12 DIAGNOSIS — E039 Hypothyroidism, unspecified: Secondary | ICD-10-CM | POA: Diagnosis not present

## 2021-03-12 DIAGNOSIS — R7301 Impaired fasting glucose: Secondary | ICD-10-CM | POA: Diagnosis not present

## 2021-03-12 DIAGNOSIS — Z0000 Encounter for general adult medical examination without abnormal findings: Secondary | ICD-10-CM | POA: Diagnosis not present

## 2021-05-21 DIAGNOSIS — J018 Other acute sinusitis: Secondary | ICD-10-CM | POA: Diagnosis not present

## 2021-06-04 DIAGNOSIS — R030 Elevated blood-pressure reading, without diagnosis of hypertension: Secondary | ICD-10-CM | POA: Diagnosis not present

## 2021-06-04 DIAGNOSIS — Z6841 Body Mass Index (BMI) 40.0 and over, adult: Secondary | ICD-10-CM | POA: Diagnosis not present

## 2021-06-04 DIAGNOSIS — J018 Other acute sinusitis: Secondary | ICD-10-CM | POA: Diagnosis not present

## 2021-10-07 DIAGNOSIS — E039 Hypothyroidism, unspecified: Secondary | ICD-10-CM | POA: Diagnosis not present

## 2021-11-25 DIAGNOSIS — J020 Streptococcal pharyngitis: Secondary | ICD-10-CM | POA: Diagnosis not present

## 2021-11-25 DIAGNOSIS — R059 Cough, unspecified: Secondary | ICD-10-CM | POA: Diagnosis not present

## 2021-11-25 DIAGNOSIS — Z20822 Contact with and (suspected) exposure to covid-19: Secondary | ICD-10-CM | POA: Diagnosis not present

## 2022-03-17 DIAGNOSIS — E039 Hypothyroidism, unspecified: Secondary | ICD-10-CM | POA: Diagnosis not present

## 2022-03-17 DIAGNOSIS — R7301 Impaired fasting glucose: Secondary | ICD-10-CM | POA: Diagnosis not present

## 2022-03-17 DIAGNOSIS — Z125 Encounter for screening for malignant neoplasm of prostate: Secondary | ICD-10-CM | POA: Diagnosis not present

## 2022-03-17 DIAGNOSIS — Z0000 Encounter for general adult medical examination without abnormal findings: Secondary | ICD-10-CM | POA: Diagnosis not present

## 2022-03-21 DIAGNOSIS — R600 Localized edema: Secondary | ICD-10-CM | POA: Diagnosis not present

## 2022-03-21 DIAGNOSIS — R7303 Prediabetes: Secondary | ICD-10-CM | POA: Diagnosis not present

## 2022-03-21 DIAGNOSIS — Z0000 Encounter for general adult medical examination without abnormal findings: Secondary | ICD-10-CM | POA: Diagnosis not present

## 2022-03-21 DIAGNOSIS — I872 Venous insufficiency (chronic) (peripheral): Secondary | ICD-10-CM | POA: Diagnosis not present

## 2022-03-21 DIAGNOSIS — E039 Hypothyroidism, unspecified: Secondary | ICD-10-CM | POA: Diagnosis not present

## 2022-04-14 DIAGNOSIS — R7301 Impaired fasting glucose: Secondary | ICD-10-CM | POA: Diagnosis not present

## 2022-04-28 DIAGNOSIS — R7301 Impaired fasting glucose: Secondary | ICD-10-CM | POA: Diagnosis not present

## 2022-07-07 DIAGNOSIS — R7301 Impaired fasting glucose: Secondary | ICD-10-CM | POA: Diagnosis not present

## 2022-07-07 DIAGNOSIS — E039 Hypothyroidism, unspecified: Secondary | ICD-10-CM | POA: Diagnosis not present

## 2022-07-07 DIAGNOSIS — E559 Vitamin D deficiency, unspecified: Secondary | ICD-10-CM | POA: Diagnosis not present

## 2022-07-07 DIAGNOSIS — E669 Obesity, unspecified: Secondary | ICD-10-CM | POA: Diagnosis not present

## 2022-07-28 DIAGNOSIS — R7303 Prediabetes: Secondary | ICD-10-CM | POA: Diagnosis not present

## 2022-10-06 DIAGNOSIS — E559 Vitamin D deficiency, unspecified: Secondary | ICD-10-CM | POA: Diagnosis not present

## 2022-10-06 DIAGNOSIS — R7301 Impaired fasting glucose: Secondary | ICD-10-CM | POA: Diagnosis not present

## 2022-10-06 DIAGNOSIS — E039 Hypothyroidism, unspecified: Secondary | ICD-10-CM | POA: Diagnosis not present

## 2022-10-14 DIAGNOSIS — R7301 Impaired fasting glucose: Secondary | ICD-10-CM | POA: Diagnosis not present

## 2022-10-14 DIAGNOSIS — E559 Vitamin D deficiency, unspecified: Secondary | ICD-10-CM | POA: Diagnosis not present

## 2022-10-14 DIAGNOSIS — E039 Hypothyroidism, unspecified: Secondary | ICD-10-CM | POA: Diagnosis not present

## 2022-10-14 DIAGNOSIS — R9431 Abnormal electrocardiogram [ECG] [EKG]: Secondary | ICD-10-CM | POA: Diagnosis not present

## 2022-10-27 DIAGNOSIS — R7303 Prediabetes: Secondary | ICD-10-CM | POA: Diagnosis not present

## 2022-11-02 DIAGNOSIS — Z23 Encounter for immunization: Secondary | ICD-10-CM | POA: Diagnosis not present

## 2023-02-09 DIAGNOSIS — R7303 Prediabetes: Secondary | ICD-10-CM | POA: Diagnosis not present

## 2023-02-14 DIAGNOSIS — R7301 Impaired fasting glucose: Secondary | ICD-10-CM | POA: Diagnosis not present

## 2023-02-14 DIAGNOSIS — E559 Vitamin D deficiency, unspecified: Secondary | ICD-10-CM | POA: Diagnosis not present

## 2023-02-21 DIAGNOSIS — N529 Male erectile dysfunction, unspecified: Secondary | ICD-10-CM | POA: Diagnosis not present

## 2023-02-21 DIAGNOSIS — E039 Hypothyroidism, unspecified: Secondary | ICD-10-CM | POA: Diagnosis not present

## 2023-02-21 DIAGNOSIS — E559 Vitamin D deficiency, unspecified: Secondary | ICD-10-CM | POA: Diagnosis not present

## 2023-04-04 DIAGNOSIS — R7301 Impaired fasting glucose: Secondary | ICD-10-CM | POA: Diagnosis not present

## 2023-04-04 DIAGNOSIS — E039 Hypothyroidism, unspecified: Secondary | ICD-10-CM | POA: Diagnosis not present

## 2023-04-04 DIAGNOSIS — E559 Vitamin D deficiency, unspecified: Secondary | ICD-10-CM | POA: Diagnosis not present

## 2023-04-10 DIAGNOSIS — Z0000 Encounter for general adult medical examination without abnormal findings: Secondary | ICD-10-CM | POA: Diagnosis not present

## 2023-05-15 DIAGNOSIS — Z800 Family history of malignant neoplasm of digestive organs: Secondary | ICD-10-CM | POA: Diagnosis not present

## 2023-05-15 DIAGNOSIS — Z1211 Encounter for screening for malignant neoplasm of colon: Secondary | ICD-10-CM | POA: Diagnosis not present

## 2023-05-22 DIAGNOSIS — E039 Hypothyroidism, unspecified: Secondary | ICD-10-CM | POA: Diagnosis not present

## 2023-05-29 DIAGNOSIS — R7301 Impaired fasting glucose: Secondary | ICD-10-CM | POA: Diagnosis not present

## 2023-05-29 DIAGNOSIS — E039 Hypothyroidism, unspecified: Secondary | ICD-10-CM | POA: Diagnosis not present

## 2023-05-29 DIAGNOSIS — R9431 Abnormal electrocardiogram [ECG] [EKG]: Secondary | ICD-10-CM | POA: Diagnosis not present

## 2023-05-29 DIAGNOSIS — E559 Vitamin D deficiency, unspecified: Secondary | ICD-10-CM | POA: Diagnosis not present

## 2023-06-22 DIAGNOSIS — K635 Polyp of colon: Secondary | ICD-10-CM | POA: Diagnosis not present

## 2023-06-22 DIAGNOSIS — Z1211 Encounter for screening for malignant neoplasm of colon: Secondary | ICD-10-CM | POA: Diagnosis not present

## 2023-11-28 DIAGNOSIS — R7301 Impaired fasting glucose: Secondary | ICD-10-CM | POA: Diagnosis not present

## 2023-11-28 DIAGNOSIS — E039 Hypothyroidism, unspecified: Secondary | ICD-10-CM | POA: Diagnosis not present

## 2023-11-28 DIAGNOSIS — E559 Vitamin D deficiency, unspecified: Secondary | ICD-10-CM | POA: Diagnosis not present

## 2023-11-28 DIAGNOSIS — Z0000 Encounter for general adult medical examination without abnormal findings: Secondary | ICD-10-CM | POA: Diagnosis not present

## 2023-11-28 DIAGNOSIS — R600 Localized edema: Secondary | ICD-10-CM | POA: Diagnosis not present

## 2023-11-28 DIAGNOSIS — R7303 Prediabetes: Secondary | ICD-10-CM | POA: Diagnosis not present

## 2023-12-05 DIAGNOSIS — E039 Hypothyroidism, unspecified: Secondary | ICD-10-CM | POA: Diagnosis not present

## 2023-12-05 DIAGNOSIS — E559 Vitamin D deficiency, unspecified: Secondary | ICD-10-CM | POA: Diagnosis not present

## 2023-12-05 DIAGNOSIS — R7303 Prediabetes: Secondary | ICD-10-CM | POA: Diagnosis not present

## 2023-12-05 DIAGNOSIS — Z23 Encounter for immunization: Secondary | ICD-10-CM | POA: Diagnosis not present

## 2023-12-05 DIAGNOSIS — R7301 Impaired fasting glucose: Secondary | ICD-10-CM | POA: Diagnosis not present
